# Patient Record
Sex: Female | Born: 1955 | Race: White | Hispanic: No | Marital: Married | Smoking: Former smoker
Health system: Southern US, Community
[De-identification: ages and names within clinical notes are randomized; demographics above are authoritative.]

## PROBLEM LIST (undated history)

## (undated) DIAGNOSIS — C50919 Malignant neoplasm of unspecified site of unspecified female breast: Secondary | ICD-10-CM

## (undated) DIAGNOSIS — J302 Other seasonal allergic rhinitis: Secondary | ICD-10-CM

## (undated) DIAGNOSIS — Z923 Personal history of irradiation: Secondary | ICD-10-CM

## (undated) DIAGNOSIS — Z8619 Personal history of other infectious and parasitic diseases: Secondary | ICD-10-CM

## (undated) DIAGNOSIS — K219 Gastro-esophageal reflux disease without esophagitis: Secondary | ICD-10-CM

## (undated) HISTORY — PX: BREAST LUMPECTOMY: SHX2

## (undated) HISTORY — DX: Malignant neoplasm of unspecified site of unspecified female breast: C50.919

## (undated) HISTORY — PX: ABDOMINAL SURGERY: SHX537

---

## 1998-08-17 ENCOUNTER — Other Ambulatory Visit: Admission: RE | Admit: 1998-08-17 | Discharge: 1998-08-17 | Payer: Self-pay | Admitting: Gynecology

## 1999-08-25 ENCOUNTER — Other Ambulatory Visit: Admission: RE | Admit: 1999-08-25 | Discharge: 1999-08-25 | Payer: Self-pay | Admitting: Gynecology

## 2000-09-12 ENCOUNTER — Other Ambulatory Visit: Admission: RE | Admit: 2000-09-12 | Discharge: 2000-09-12 | Payer: Self-pay | Admitting: Gynecology

## 2001-10-01 ENCOUNTER — Other Ambulatory Visit: Admission: RE | Admit: 2001-10-01 | Discharge: 2001-10-01 | Payer: Self-pay | Admitting: Gynecology

## 2001-10-23 ENCOUNTER — Encounter: Payer: Self-pay | Admitting: *Deleted

## 2001-10-23 ENCOUNTER — Encounter: Admission: RE | Admit: 2001-10-23 | Discharge: 2001-10-23 | Payer: Self-pay | Admitting: *Deleted

## 2002-03-28 ENCOUNTER — Ambulatory Visit (HOSPITAL_COMMUNITY): Admission: RE | Admit: 2002-03-28 | Discharge: 2002-03-28 | Payer: Self-pay | Admitting: *Deleted

## 2002-03-28 ENCOUNTER — Encounter: Payer: Self-pay | Admitting: *Deleted

## 2002-07-03 ENCOUNTER — Encounter: Admission: RE | Admit: 2002-07-03 | Discharge: 2002-07-03 | Payer: Self-pay | Admitting: Family Medicine

## 2002-07-03 ENCOUNTER — Encounter: Payer: Self-pay | Admitting: Family Medicine

## 2002-11-07 ENCOUNTER — Ambulatory Visit (HOSPITAL_COMMUNITY): Admission: RE | Admit: 2002-11-07 | Discharge: 2002-11-07 | Payer: Self-pay | Admitting: Gastroenterology

## 2002-11-07 ENCOUNTER — Encounter (INDEPENDENT_AMBULATORY_CARE_PROVIDER_SITE_OTHER): Payer: Self-pay | Admitting: Specialist

## 2002-11-12 ENCOUNTER — Other Ambulatory Visit: Admission: RE | Admit: 2002-11-12 | Discharge: 2002-11-12 | Payer: Self-pay | Admitting: Gynecology

## 2003-07-28 ENCOUNTER — Encounter: Payer: Self-pay | Admitting: Family Medicine

## 2003-07-28 ENCOUNTER — Encounter: Admission: RE | Admit: 2003-07-28 | Discharge: 2003-07-28 | Payer: Self-pay | Admitting: Family Medicine

## 2003-08-02 ENCOUNTER — Emergency Department (HOSPITAL_COMMUNITY): Admission: EM | Admit: 2003-08-02 | Discharge: 2003-08-02 | Payer: Self-pay | Admitting: Emergency Medicine

## 2003-08-08 ENCOUNTER — Encounter: Payer: Self-pay | Admitting: Gastroenterology

## 2003-08-08 ENCOUNTER — Ambulatory Visit (HOSPITAL_COMMUNITY): Admission: RE | Admit: 2003-08-08 | Discharge: 2003-08-08 | Payer: Self-pay | Admitting: Gastroenterology

## 2003-12-17 ENCOUNTER — Other Ambulatory Visit: Admission: RE | Admit: 2003-12-17 | Discharge: 2003-12-17 | Payer: Self-pay | Admitting: Gynecology

## 2003-12-23 ENCOUNTER — Encounter (INDEPENDENT_AMBULATORY_CARE_PROVIDER_SITE_OTHER): Payer: Self-pay | Admitting: Specialist

## 2003-12-23 ENCOUNTER — Ambulatory Visit (HOSPITAL_COMMUNITY): Admission: RE | Admit: 2003-12-23 | Discharge: 2003-12-23 | Payer: Self-pay | Admitting: Gastroenterology

## 2005-01-03 ENCOUNTER — Encounter: Admission: RE | Admit: 2005-01-03 | Discharge: 2005-01-03 | Payer: Self-pay | Admitting: Family Medicine

## 2005-01-11 ENCOUNTER — Encounter: Admission: RE | Admit: 2005-01-11 | Discharge: 2005-01-11 | Payer: Self-pay | Admitting: Family Medicine

## 2005-02-07 ENCOUNTER — Ambulatory Visit (HOSPITAL_COMMUNITY): Admission: RE | Admit: 2005-02-07 | Discharge: 2005-02-07 | Payer: Self-pay | Admitting: Gynecology

## 2005-08-23 ENCOUNTER — Encounter: Admission: RE | Admit: 2005-08-23 | Discharge: 2005-08-23 | Payer: Self-pay | Admitting: Gastroenterology

## 2005-09-02 ENCOUNTER — Encounter: Admission: RE | Admit: 2005-09-02 | Discharge: 2005-09-02 | Payer: Self-pay | Admitting: Gastroenterology

## 2006-02-20 ENCOUNTER — Other Ambulatory Visit: Admission: RE | Admit: 2006-02-20 | Discharge: 2006-02-20 | Payer: Self-pay | Admitting: Gynecology

## 2006-02-21 ENCOUNTER — Encounter: Admission: RE | Admit: 2006-02-21 | Discharge: 2006-02-21 | Payer: Self-pay | Admitting: Family Medicine

## 2006-03-09 ENCOUNTER — Emergency Department (HOSPITAL_COMMUNITY): Admission: EM | Admit: 2006-03-09 | Discharge: 2006-03-09 | Payer: Self-pay | Admitting: Emergency Medicine

## 2006-06-16 ENCOUNTER — Encounter: Admission: RE | Admit: 2006-06-16 | Discharge: 2006-06-16 | Payer: Self-pay | Admitting: Family Medicine

## 2006-09-05 ENCOUNTER — Encounter: Admission: RE | Admit: 2006-09-05 | Discharge: 2006-09-05 | Payer: Self-pay | Admitting: Family Medicine

## 2006-10-13 ENCOUNTER — Encounter: Admission: RE | Admit: 2006-10-13 | Discharge: 2006-10-13 | Payer: Self-pay | Admitting: Family Medicine

## 2007-04-02 ENCOUNTER — Encounter: Admission: RE | Admit: 2007-04-02 | Discharge: 2007-04-02 | Payer: Self-pay | Admitting: Family Medicine

## 2007-10-10 ENCOUNTER — Encounter: Admission: RE | Admit: 2007-10-10 | Discharge: 2007-10-10 | Payer: Self-pay | Admitting: Family Medicine

## 2007-12-31 ENCOUNTER — Ambulatory Visit (HOSPITAL_COMMUNITY): Admission: RE | Admit: 2007-12-31 | Discharge: 2007-12-31 | Payer: Self-pay | Admitting: Family Medicine

## 2008-01-14 ENCOUNTER — Other Ambulatory Visit: Admission: RE | Admit: 2008-01-14 | Discharge: 2008-01-14 | Payer: Self-pay | Admitting: Gynecology

## 2009-06-23 ENCOUNTER — Encounter: Admission: RE | Admit: 2009-06-23 | Discharge: 2009-06-23 | Payer: Self-pay | Admitting: Family Medicine

## 2009-12-16 ENCOUNTER — Encounter: Admission: RE | Admit: 2009-12-16 | Discharge: 2009-12-16 | Payer: Self-pay | Admitting: Otolaryngology

## 2011-04-22 NOTE — Op Note (Signed)
   NAME:  Angelica Hartman, Angelica Hartman                            ACCOUNT NO.:  [id number]   MEDICAL RECORD NO.:  [id number]                   PATIENT TYPE:  AMB   LOCATION:  ENDO                                 FACILITY:  [facility]   PHYSICIAN:  John C. Madilyn Fireman, M.D.                 DATE OF BIRTH:  1956/09/30   DATE OF PROCEDURE:  11/07/2002  DATE OF DISCHARGE:                                 OPERATIVE REPORT   PROCEDURE:  Esophagogastroduodenoscopy.   INDICATIONS FOR PROCEDURE:  Left upper quadrant abdominal pain previously  temporarily relieved by treatment for eradication of helicobacter but with  subsequent recurrence with some inconsistent response to antacids but little  response to proton pump inhibitor.   DESCRIPTION OF PROCEDURE:  The patient was placed in the left lateral  decubitus position then placed on the pulse monitor with continuous low flow  oxygen delivered by nasal cannula. She was sedated with 75 mcg IV fentanyl  and 8 mg IV Versed. The Olympus video endoscope was advanced under direct  vision into the oropharynx and esophagus. The esophagus was straight and of  normal caliber with the squamocolumnar line at 38 cm. There was no visible  hiatal hernia, ring, stricture or other abnormality of the GE junction. The  stomach was entered and a small amount of liquid secretions were suctioned  from the fundus. Retroflexed view of the cardia was unremarkable. The  fundus, body, antrum and pylorus all appeared normal. The duodenum was  entered and both the bulb and second portion are well inspected and appear  to be within normal limits.  The scope was then withdrawn back into the  stomach and CLOtest and biopsies were obtained. The scope was then withdrawn  and the patient returned to the recovery room in stable condition. She  tolerated the procedure well and there were no immediate complications.   IMPRESSION:  Basically normal endoscopy.   PLAN:  Await biopsies and CLOtest for now and  will treat for eradication of  helicobacter is positive.                                                John C. Madilyn Fireman, M.D.    JCH/MEDQ  D:  11/07/2002  T:  11/07/2002  Job:  [id number]   cc:   L. Lupe Carney, M.D.  [address] [place]  [place] [address]  Fax: [phone]

## 2011-04-22 NOTE — Op Note (Signed)
Angelica Hartman, Angelica Hartman                            ACCOUNT NO.:  [id number]   MEDICAL RECORD NO.:  [id number]                   PATIENT TYPE:  AMB   LOCATION:  ENDO                                 FACILITY:  [facility]   PHYSICIAN:  John C. Madilyn Fireman, M.D.                 DATE OF BIRTH:  Aug 01, 1956   DATE OF PROCEDURE:  12/23/2003  DATE OF DISCHARGE:                                 OPERATIVE REPORT   PROCEDURE:  Colonoscopy with polypectomy.   INDICATIONS FOR PROCEDURE:  Left upper quadrant abdominal pain with negative  workup to date.   DESCRIPTION OF PROCEDURE:  The patient was placed in the left lateral  decubitus position then placed on the pulse monitor with continuous low flow  oxygen delivered by nasal cannula. She was sedated with 100 mcg IV fentanyl  and 10 mg IV Versed. The Olympus video colonoscope was inserted into the  rectum and advanced to the cecum, confirmed by transillumination at  McBurney's point and visualization of the ileocecal valve and appendiceal  orifice. The prep was excellent. The cecum, ascending, transverse,  descending and sigmoid colon all appeared normal with no masses, polyps,  diverticula or other mucosal abnormalities. There was an 8 mm rectal polyp  at 15 cm and 6 mm polyp at 12 cm which were both removed by snare.  The  remainder of the rectum appeared normal.  The scope was then withdrawn and  the patient returned to the recovery room in stable condition.  She  tolerated the procedure well and there were no immediate complications.   IMPRESSION:  Rectal polyps otherwise normal study.   PLAN:  Await histology to determine method and interval for future colon  screening.                                               John C. Madilyn Fireman, M.D.    JCH/MEDQ  D:  12/23/2003  T:  12/23/2003  Job:  [id number]   cc:   L. Lupe Carney, M.D.  [address] [place]  [place] [address]  Fax: [phone]

## 2012-03-13 ENCOUNTER — Other Ambulatory Visit: Payer: Self-pay | Admitting: Family Medicine

## 2012-03-13 DIAGNOSIS — R52 Pain, unspecified: Secondary | ICD-10-CM

## 2012-03-15 ENCOUNTER — Ambulatory Visit
Admission: RE | Admit: 2012-03-15 | Discharge: 2012-03-15 | Disposition: A | Discharge status: Home / Self Care | Payer: BC Managed Care – PPO | Source: Ambulatory Visit | Attending: Family Medicine | Admitting: Family Medicine

## 2012-03-15 DIAGNOSIS — R52 Pain, unspecified: Secondary | ICD-10-CM

## 2013-10-14 ENCOUNTER — Emergency Department (INDEPENDENT_AMBULATORY_CARE_PROVIDER_SITE_OTHER)
Admission: EM | Admit: 2013-10-14 | Discharge: 2013-10-14 | Disposition: A | Discharge status: Home / Self Care | Payer: BC Managed Care – PPO | Source: Home / Self Care | Attending: Family Medicine | Admitting: Family Medicine

## 2013-10-14 ENCOUNTER — Encounter: Payer: Self-pay | Admitting: Emergency Medicine

## 2013-10-14 DIAGNOSIS — B001 Herpesviral vesicular dermatitis: Secondary | ICD-10-CM

## 2013-10-14 DIAGNOSIS — J069 Acute upper respiratory infection, unspecified: Secondary | ICD-10-CM

## 2013-10-14 DIAGNOSIS — B009 Herpesviral infection, unspecified: Secondary | ICD-10-CM

## 2013-10-14 HISTORY — DX: Other seasonal allergic rhinitis: J30.2

## 2013-10-14 MED ORDER — BENZONATATE 200 MG PO CAPS: 200.0000 mg | ORAL_CAPSULE | Freq: Every day | ORAL | Status: DC

## 2013-10-14 MED ORDER — VALACYCLOVIR HCL 1 G PO TABS: ORAL_TABLET | ORAL | Status: DC

## 2013-10-14 NOTE — ED Notes (Signed)
Pt c/o RT ear ache, nasal congestion, cold sore under nose, HA and pressure behind her eyes x 5 days. denies fever.

## 2013-10-14 NOTE — ED Provider Notes (Signed)
CSN: [id number]     Arrival date & time 10/14/13  1054 History   First MD Initiated Contact with Patient 10/14/13 1110     Chief Complaint  Patient presents with  . Nasal Congestion  . Otalgia     HPI Comments: Patient complains of onset of a right earache and right sore neck 5 days ago.  She felt fatigued and developed sinus congestion but no sore throat.  Yesterday she developed a headache and a "cold sore" underneath her nose.  No fevers, chills, and sweats   The history is provided by the patient.    Past Medical History  Diagnosis Date  . Seasonal allergies    Past Surgical History  Procedure Laterality Date  . Abdominal surgery    . Breast lumpectomy Right    Family History  Problem Relation Age of Onset  . Alzheimer's disease Mother    History  Substance Use Topics  . Smoking status: Former Games developer  . Smokeless tobacco: Not on file  . Alcohol Use: No   OB History   Grav Para Term Preterm Abortions TAB SAB Ect Mult Living                 Review of Systems No sore throat No cough No pleuritic pain No wheezing + cold sore + nasal congestion + post-nasal drainage No sinus pain/pressure No itchy/red eyes + right earache No hemoptysis No SOB No fever/chills No nausea No vomiting No abdominal pain No diarrhea No urinary symptoms No skin rash + fatigue No myalgias + headache Used OTC meds without relief  Allergies  Codeine and Penicillins  Home Medications   Current Outpatient Rx  Name  Route  Sig  Dispense  Refill  . cetirizine (ZYRTEC) 10 MG tablet   Oral   Take 10 mg by mouth daily.         . polyethylene glycol (MIRALAX / GLYCOLAX) packet   Oral   Take 17 g by mouth daily.         . benzonatate (TESSALON) 200 MG capsule   Oral   Take 1 capsule (200 mg total) by mouth at bedtime. Take as needed for cough   12 capsule   0   . valACYclovir (VALTREX) 1000 MG tablet      Take two tabs by mouth every 12 hours for one day.  Take at  onset of cold sore   4 tablet   1    BP 112/69  Pulse 60  Temp(Src) 97.7 F (36.5 C) (Oral)  Resp 16  Ht 5\' 1"  (1.549 m)  Wt 136 lb (61.689 kg)  BMI 25.71 kg/m2  SpO2 100% Physical Exam  HENT:  Nose:    Just under patient's nares there is a small 8mm patch of erythema with tenderness to palpation but no swelling or vesicles.  Nursing notes and Vital Signs reviewed. Appearance:  Patient appears healthy, stated age, and in no acute distress Eyes:  Pupils are equal, round, and reactive to light and accomodation.  Extraocular movement is intact.  Conjunctivae are not inflamed  Ears:  Canals normal.  Tympanic membranes normal.  Nose:  Congested turbinates.  No sinus tenderness. Marland Kitchen  Pharynx:  Normal Neck:  Supple.  Tender shotty right posterior nodes are present. Lungs:  Clear to auscultation.  Breath sounds are equal.  Heart:  Regular rate and rhythm without murmurs, rubs, or gallops.  Abdomen:  Nontender without masses or hepatosplenomegaly.  Bowel sounds are present.  No  CVA or flank tenderness.  Extremities:  No edema.  No calf tenderness Skin:  No rash present.    ED Course  Procedures  none       MDM   1. Acute upper respiratory infections of unspecified site   2. Cold sore    There is no evidence of bacterial infection today.   Rx for Valtrex. If increasing cold symptoms develop: Take Mucinex D (guaifenesin with decongestant) twice daily for congestion.  Increase fluid intake, rest. May use Afrin nasal spray (or generic oxymetazoline) twice daily for about 5 days.  Also recommend using saline nasal spray several times daily and saline nasal irrigation (AYR is a common brand).  May use Flonase spray after Afrin and saline. Stop all antihistamines for now, and other non-prescription cough/cold preparations. Use Tessalon at night for cough Follow-up with family doctor if not improving about10 days.     Lattie Haw, MD 10/14/13 [phone]

## 2014-04-22 ENCOUNTER — Other Ambulatory Visit: Payer: Self-pay | Admitting: Family Medicine

## 2014-04-22 DIAGNOSIS — G4482 Headache associated with sexual activity: Secondary | ICD-10-CM

## 2014-04-29 ENCOUNTER — Ambulatory Visit
Admission: RE | Admit: 2014-04-29 | Discharge: 2014-04-29 | Disposition: A | Discharge status: Home / Self Care | Payer: BC Managed Care – PPO | Source: Ambulatory Visit | Attending: Family Medicine | Admitting: Family Medicine

## 2014-04-29 DIAGNOSIS — G4482 Headache associated with sexual activity: Secondary | ICD-10-CM

## 2014-04-29 MED ORDER — GADOBENATE DIMEGLUMINE 529 MG/ML IV SOLN
13.0000 mL | Freq: Once | INTRAVENOUS | Status: AC | PRN
  Administered 2014-04-29: 13 mL via INTRAVENOUS

## 2014-07-24 ENCOUNTER — Other Ambulatory Visit: Payer: Self-pay

## 2014-07-28 LAB — CYTOLOGY - PAP

## 2014-08-19 ENCOUNTER — Emergency Department (INDEPENDENT_AMBULATORY_CARE_PROVIDER_SITE_OTHER)
Admission: EM | Admit: 2014-08-19 | Discharge: 2014-08-19 | Disposition: A | Discharge status: Home / Self Care | Payer: BC Managed Care – PPO | Source: Home / Self Care | Attending: Physician Assistant | Admitting: Physician Assistant

## 2014-08-19 ENCOUNTER — Encounter: Payer: Self-pay | Admitting: Emergency Medicine

## 2014-08-19 DIAGNOSIS — K589 Irritable bowel syndrome without diarrhea: Secondary | ICD-10-CM

## 2014-08-19 DIAGNOSIS — K219 Gastro-esophageal reflux disease without esophagitis: Secondary | ICD-10-CM

## 2014-08-19 HISTORY — DX: Personal history of other infectious and parasitic diseases: Z86.19

## 2014-08-19 MED ORDER — DICYCLOMINE HCL 10 MG PO CAPS: 10.0000 mg | ORAL_CAPSULE | Freq: Three times a day (TID) | ORAL | Status: DC

## 2014-08-19 MED ORDER — OMEPRAZOLE 40 MG PO CPDR: 40.0000 mg | DELAYED_RELEASE_CAPSULE | Freq: Every day | ORAL | Status: DC

## 2014-08-19 MED ORDER — GI COCKTAIL ~~LOC~~
30.0000 mL | Freq: Once | ORAL | Status: AC
  Administered 2014-08-19: 30 mL via ORAL

## 2014-08-19 MED ORDER — SERTRALINE HCL 25 MG PO TABS: 25.0000 mg | ORAL_TABLET | Freq: Every day | ORAL | Status: AC

## 2014-08-19 NOTE — Discharge Instructions (Signed)
Start priobiotic- Karn Pickler is a good one.  Start omeprazole for Acid reflux.  zoloft and bentyl for IBS.   Diet and Irritable Bowel Syndrome  No cure has been found for irritable bowel syndrome (IBS). Many options are available to treat the symptoms. Your caregiver will give you the best treatments available for your symptoms. He or she will also encourage you to manage stress and to make changes to your diet. You need to work with your caregiver and Registered Dietician to find the best combination of medicine, diet, counseling, and support to control your symptoms. The following are some diet suggestions. FOODS THAT MAKE IBS WORSE  Fatty foods, such as [place] fries.  Milk products, such as cheese or ice cream.  Chocolate.  Alcohol.  Caffeine (found in coffee and some sodas).  Carbonated drinks, such as soda. If certain foods cause symptoms, you should eat less of them or stop eating them. FOOD JOURNAL   Keep a journal of the foods that seem to cause distress. Write down:  What you are eating during the day and when.  What problems you are having after eating.  When the symptoms occur in relation to your meals.  What foods always make you feel badly.  Take your notes with you to your caregiver to see if you should stop eating certain foods. FOODS THAT MAKE IBS BETTER Fiber reduces IBS symptoms, especially constipation, because it makes stools soft, bulky, and easier to pass. Fiber is found in bran, bread, cereal, beans, fruit, and vegetables. Examples of foods with fiber include:  Apples.  Peaches.  Pears.  Berries.  Figs.  Broccoli, raw.  Cabbage.  Carrots.  Raw peas.  Kidney beans.  Lima beans.  Whole-grain bread.  Whole-grain cereal. Add foods with fiber to your diet a little at a time. This will let your body get used to them. Too much fiber at once might cause gas and swelling of your abdomen. This can trigger symptoms in a person with IBS. Caregivers  usually recommend a diet with enough fiber to produce soft, painless bowel movements. High fiber diets may cause gas and bloating. However, these symptoms often go away within a few weeks, as your body adjusts. In many cases, dietary fiber may lessen IBS symptoms, particularly constipation. However, it may not help pain or diarrhea. High fiber diets keep the colon mildly enlarged (distended) with the added fiber. This may help prevent spasms in the colon. Some forms of fiber also keep water in the stool, thereby preventing hard stools that are difficult to pass.  Besides telling you to eat more foods with fiber, your caregiver may also tell you to get more fiber by taking a fiber pill or drinking water mixed with a special high fiber powder. An example of this is a natural fiber laxative containing psyllium seed.  TIPS  Large meals can cause cramping and diarrhea in people with IBS. If this happens to you, try eating 4 or 5 small meals a day, or try eating less at each of your usual 3 meals. It may also help if your meals are low in fat and high in carbohydrates. Examples of carbohydrates are pasta, rice, whole-grain breads and cereals, fruits, and vegetables.  If dairy products cause your symptoms to flare up, you can try eating less of those foods. You might be able to handle yogurt better than other dairy products, because it contains bacteria that helps with digestion. Dairy products are an important source of calcium and  other nutrients. If you need to avoid dairy products, be sure to talk with a Registered Dietitian about getting these nutrients through other food sources.  Drink enough water and fluids to keep your urine clear or pale yellow. This is important, especially if you have diarrhea. FOR MORE INFORMATION  International Foundation for Functional Gastrointestinal Disorders: www.iffgd.org  National Digestive Diseases Information Clearinghouse: digestive.StageSync.si Document Released:  02/11/2004 Document Revised: 02/13/2012 Document Reviewed: 02/21/2014 [facility] Patient Information 2015 [place], [place]. This information is not intended to replace advice given to you by your health care provider. Make sure you discuss any questions you have with your health care provider. Irritable Bowel Syndrome Irritable bowel syndrome (IBS) is caused by a disturbance of normal bowel function and is a common digestive disorder. You may also hear this condition called spastic colon, mucous colitis, and irritable colon. There is no cure for IBS. However, symptoms often gradually improve or disappear with a good diet, stress management, and medicine. This condition usually appears in late adolescence or early adulthood. Women develop it twice as often as men. CAUSES  After food has been digested and absorbed in the small intestine, waste material is moved into the large intestine, or colon. In the colon, water and salts are absorbed from the undigested products coming from the small intestine. The remaining residue, or fecal material, is held for elimination. Under normal circumstances, gentle, rhythmic contractions of the bowel walls push the fecal material along the colon toward the rectum. In IBS, however, these contractions are irregular and poorly coordinated. The fecal material is either retained too long, resulting in constipation, or expelled too soon, producing diarrhea. SIGNS AND SYMPTOMS  The most common symptom of IBS is abdominal pain. It is often in the lower left side of the abdomen, but it may occur anywhere in the abdomen. The pain comes from spasms of the bowel muscles happening too much and from the buildup of gas and fecal material in the colon. This pain:  Can range from sharp abdominal cramps to a dull, continuous ache.  Often worsens soon after eating.  Is often relieved by having a bowel movement or passing gas. Abdominal pain is usually accompanied by constipation, but it may  also produce diarrhea. The diarrhea often occurs right after a meal or upon waking up in the morning. The stools are often soft, watery, and flecked with mucus. Other symptoms of IBS include:  Bloating.  Loss of appetite.  Heartburn.  Backache.  Dull pain in the arms or shoulders.  Nausea.  Burping.  Vomiting.  Gas. IBS may also cause symptoms that are unrelated to the digestive system, such as:  Fatigue.  Headaches.  Anxiety.  Shortness of breath.  Trouble concentrating.  Dizziness. These symptoms tend to come and go. DIAGNOSIS  The symptoms of IBS may seem like symptoms of other, more serious digestive disorders. Your health care provider may want to perform tests to exclude these disorders.  TREATMENT Many medicines are available to help correct bowel function or relieve bowel spasms and abdominal pain. Among the medicines available are:  Laxatives for severe constipation and to help restore normal bowel habits.  Specific antidiarrheal medicines to treat severe or lasting diarrhea.  Antispasmodic agents to relieve intestinal cramps. Your health care provider may also decide to treat you with a mild tranquilizer or sedative during unusually stressful periods in your life. Your health care provider may also prescribe antidepressant medicine. The use of this medicine has been shown to reduce pain  and other symptoms of IBS. Remember that if any medicine is prescribed for you, you should take it exactly as directed. Make sure your health care provider knows how well it worked for you. HOME CARE INSTRUCTIONS   Take all medicines as directed by your health care provider.  Avoid foods that are high in fat or oils, such as heavy cream, butter, frankfurters, sausage, and other fatty meats.  Avoid foods that make you go to the bathroom, such as fruit, fruit juice, and dairy products.  Cut out carbonated drinks, chewing gum, and "gassy" foods such as beans and cabbage.  This may help relieve bloating and burping.  Eat foods with bran, and drink plenty of liquids with the bran foods. This helps relieve constipation.  Keep track of what foods seem to bring on your symptoms.  Avoid emotionally charged situations or circumstances that produce anxiety.  Start or continue exercising.  Get plenty of rest and sleep. Document Released: 11/21/2005 Document Revised: 11/26/2013 Document Reviewed: 07/11/2008 [facility] Patient Information 2015 [place], [place]. This information is not intended to replace advice given to you by your health care provider. Make sure you discuss any questions you have with your health care provider.

## 2014-08-19 NOTE — ED Provider Notes (Signed)
CSN: [id number]     Arrival date & time 08/19/14  1502 History   First MD Initiated Contact with Patient 08/19/14 1523     Chief Complaint  Patient presents with  . Gas   (Consider location/radiation/quality/duration/timing/severity/associated sxs/prior Treatment) HPI Pt is a 58 yo female who presents to the clinic with abdominal discomfort, gas, "gurgling feeling" for last 2 months. She has a hx of constipation that she takes miralax daily for. She now has 5-7 loose stools a day. She has cut back on miralax. Certain foods do make worse such as yogurt and salad. Denies any blood in stool. She has acid reflux and belching a lot she is not currently being treated with medications. She does admit to her job being more stressful. She had normal colonoscopy at 52. No fever, chills, nausea, vomiting.   Past Medical History  Diagnosis Date  . Seasonal allergies   . History of Helicobacter pylori infection    Past Surgical History  Procedure Laterality Date  . Abdominal surgery    . Breast lumpectomy Right    Family History  Problem Relation Age of Onset  . Alzheimer's disease Mother    History  Substance Use Topics  . Smoking status: Former Games developer  . Smokeless tobacco: Never Used  . Alcohol Use: No   OB History   Grav Para Term Preterm Abortions TAB SAB Ect Mult Living                 Review of Systems  All other systems reviewed and are negative.   Allergies  Codeine and Penicillins  Home Medications   Prior to Admission medications   Medication Sig Start Date End Date Taking? Authorizing Provider  benzonatate (TESSALON) 200 MG capsule Take 1 capsule (200 mg total) by mouth at bedtime. Take as needed for cough 10/14/13   Lattie Haw, MD  cetirizine (ZYRTEC) 10 MG tablet Take 10 mg by mouth daily.    Historical Provider, MD  dicyclomine (BENTYL) 10 MG capsule Take 1 capsule (10 mg total) by mouth 3 (three) times daily before meals. 08/19/14   Jade L Breeback, PA-C   omeprazole (PRILOSEC) 40 MG capsule Take 1 capsule (40 mg total) by mouth daily. 08/19/14   Jade L Breeback, PA-C  polyethylene glycol (MIRALAX / GLYCOLAX) packet Take 17 g by mouth daily.    Historical Provider, MD  sertraline (ZOLOFT) 25 MG tablet Take 1 tablet (25 mg total) by mouth daily. Increase to 2 tablets and 7 days. 08/19/14   Jade L Breeback, PA-C  valACYclovir (VALTREX) 1000 MG tablet Take two tabs by mouth every 12 hours for one day.  Take at onset of cold sore 10/14/13   Lattie Haw, MD   BP 119/77  Pulse 59  Temp(Src) 98.2 F (36.8 C) (Oral)  Resp 14  Wt 141 lb (63.957 kg)  SpO2 100% Physical Exam  Constitutional: She is oriented to person, place, and time. She appears well-developed and well-nourished.  HENT:  Head: Normocephalic and atraumatic.  Cardiovascular: Normal rate, regular rhythm and normal heart sounds.   Pulmonary/Chest: Effort normal and breath sounds normal. She has no wheezes.  Negative for any CVA tenderness.   Abdominal: Soft.  Hyperactive bowel sounds.  Discomfort over entire abdomen but no overt pain.  No guarding or rebound.   Neurological: She is alert and oriented to person, place, and time.  Skin: Skin is dry.  Psychiatric: She has a normal mood and affect. Her behavior is  normal.    ED Course  Procedures (including critical care time) Labs Review Labs Reviewed - No data to display  Imaging Review No results found.   MDM   1. Gastroesophageal reflux disease, esophagitis presence not specified   2. IBS (irritable bowel syndrome)    GI cocktail given in office today with 30 percent relief by the time she left.  Pt started on omeprazole in the morning.  Stop miralax.  Start probiotic daily.  Start bentyl as needed up to three times a day before meals.  Start zoloft  can increase to  after 7 days. SE effects of worsening depression, sexual side effects, and weight gain discussed. Call with side effects.   Discussed  importance of follow up with PCP before medications run out or if symptoms worsening.  Red flag symptoms discussed.   IBS diet and HO given.    Jomarie Longs, PA-C 08/19/14 1612  Jomarie Longs, PA-C 08/19/14 [phone]

## 2014-08-19 NOTE — ED Notes (Signed)
Angelica Hartman c/o abdominal discomfort. She feels like her GI tract is always "moving" and she is frequently gassy. Stools are loose. No blood in stool.

## 2014-08-25 NOTE — ED Provider Notes (Signed)
Agree with exam, assessment, and plan.   Lattie Haw, MD 08/25/14 1158

## 2014-09-17 ENCOUNTER — Other Ambulatory Visit: Payer: Self-pay | Admitting: Gastroenterology

## 2014-09-17 DIAGNOSIS — R1013 Epigastric pain: Secondary | ICD-10-CM

## 2014-09-17 DIAGNOSIS — IMO0001 Reserved for inherently not codable concepts without codable children: Secondary | ICD-10-CM

## 2014-09-19 ENCOUNTER — Ambulatory Visit
Admission: RE | Admit: 2014-09-19 | Discharge: 2014-09-19 | Disposition: A | Discharge status: Home / Self Care | Payer: BC Managed Care – PPO | Source: Ambulatory Visit | Attending: Gastroenterology | Admitting: Gastroenterology

## 2014-09-19 DIAGNOSIS — R1013 Epigastric pain: Secondary | ICD-10-CM

## 2014-09-19 DIAGNOSIS — IMO0001 Reserved for inherently not codable concepts without codable children: Secondary | ICD-10-CM

## 2014-10-07 ENCOUNTER — Other Ambulatory Visit: Payer: Self-pay | Admitting: Gastroenterology

## 2014-10-07 DIAGNOSIS — R1013 Epigastric pain: Secondary | ICD-10-CM

## 2014-10-14 ENCOUNTER — Encounter (INDEPENDENT_AMBULATORY_CARE_PROVIDER_SITE_OTHER): Payer: Self-pay

## 2014-10-14 ENCOUNTER — Ambulatory Visit
Admission: RE | Admit: 2014-10-14 | Discharge: 2014-10-14 | Disposition: A | Discharge status: Home / Self Care | Payer: BC Managed Care – PPO | Source: Ambulatory Visit | Attending: Gastroenterology | Admitting: Gastroenterology

## 2014-10-14 DIAGNOSIS — R1013 Epigastric pain: Secondary | ICD-10-CM

## 2015-07-28 ENCOUNTER — Other Ambulatory Visit: Payer: Self-pay | Admitting: Gynecology

## 2015-07-29 LAB — CYTOLOGY - PAP

## 2016-03-07 ENCOUNTER — Encounter (HOSPITAL_COMMUNITY): Payer: Self-pay | Admitting: Adult Health

## 2016-03-07 ENCOUNTER — Emergency Department (HOSPITAL_COMMUNITY)
Admission: EM | Admit: 2016-03-07 | Discharge: 2016-03-07 | Disposition: A | Discharge status: Home / Self Care | Payer: BLUE CROSS/BLUE SHIELD | Attending: Emergency Medicine | Admitting: Emergency Medicine

## 2016-03-07 DIAGNOSIS — L509 Urticaria, unspecified: Secondary | ICD-10-CM | POA: Insufficient documentation

## 2016-03-07 DIAGNOSIS — L500 Allergic urticaria: Secondary | ICD-10-CM | POA: Diagnosis not present

## 2016-03-07 DIAGNOSIS — R21 Rash and other nonspecific skin eruption: Secondary | ICD-10-CM | POA: Diagnosis present

## 2016-03-07 DIAGNOSIS — Z87891 Personal history of nicotine dependence: Secondary | ICD-10-CM | POA: Insufficient documentation

## 2016-03-07 DIAGNOSIS — Z79899 Other long term (current) drug therapy: Secondary | ICD-10-CM | POA: Diagnosis not present

## 2016-03-07 DIAGNOSIS — Z8619 Personal history of other infectious and parasitic diseases: Secondary | ICD-10-CM | POA: Diagnosis not present

## 2016-03-07 DIAGNOSIS — Z880 Allergy status to penicillin: Secondary | ICD-10-CM | POA: Insufficient documentation

## 2016-03-07 MED ORDER — FAMOTIDINE 20 MG PO TABS
40.0000 mg | ORAL_TABLET | Freq: Once | ORAL | Status: AC
  Administered 2016-03-07: 40 mg via ORAL
  Filled 2016-03-07: qty 2

## 2016-03-07 MED ORDER — HYDROCORTISONE 2.5 % EX LOTN: TOPICAL_LOTION | Freq: Two times a day (BID) | CUTANEOUS | Status: DC

## 2016-03-07 MED ORDER — HYDROXYZINE HCL 25 MG PO TABS
50.0000 mg | ORAL_TABLET | Freq: Once | ORAL | Status: AC
  Administered 2016-03-07: 50 mg via ORAL
  Filled 2016-03-07: qty 2

## 2016-03-07 MED ORDER — CETIRIZINE HCL 10 MG PO TABS: 10.0000 mg | ORAL_TABLET | Freq: Every day | ORAL | Status: AC

## 2016-03-07 MED ORDER — PREDNISONE 20 MG PO TABS
60.0000 mg | ORAL_TABLET | Freq: Once | ORAL | Status: AC
  Administered 2016-03-07: 60 mg via ORAL
  Filled 2016-03-07: qty 3

## 2016-03-07 MED ORDER — HYDROXYZINE HCL 25 MG PO TABS: 25.0000 mg | ORAL_TABLET | Freq: Four times a day (QID) | ORAL | Status: DC | PRN

## 2016-03-07 MED ORDER — PREDNISONE 20 MG PO TABS: 40.0000 mg | ORAL_TABLET | Freq: Every day | ORAL | Status: DC

## 2016-03-07 NOTE — ED Notes (Signed)
MD at bedside. 

## 2016-03-07 NOTE — Discharge Instructions (Signed)
Hives Hives are itchy, red, swollen areas of the skin. They can vary in size and location on your body. Hives can come and go for hours or several days (acute hives) or for several weeks (chronic hives). Hives do not spread from person to person (noncontagious). They may get worse with scratching, exercise, and emotional stress. CAUSES   Allergic reaction to food, additives, or drugs.  Infections, including the common cold.  Illness, such as vasculitis, lupus, or thyroid disease.  Exposure to sunlight, heat, or cold.  Exercise.  Stress.  Contact with chemicals. SYMPTOMS   Red or white swollen patches on the skin. The patches may change size, shape, and location quickly and repeatedly.  Itching.  Swelling of the hands, feet, and face. This may occur if hives develop deeper in the skin. DIAGNOSIS  Your caregiver can usually tell what is wrong by performing a physical exam. Skin or blood tests may also be done to determine the cause of your hives. In some cases, the cause cannot be determined. TREATMENT  Mild cases usually get better with medicines such as antihistamines. Severe cases may require an emergency epinephrine injection. If the cause of your hives is known, treatment includes avoiding that trigger.  HOME CARE INSTRUCTIONS   Avoid causes that trigger your hives.  Take antihistamines as directed by your caregiver to reduce the severity of your hives. Non-sedating or low-sedating antihistamines are usually recommended. Do not drive while taking an antihistamine.  Take any other medicines prescribed for itching as directed by your caregiver.  Wear loose-fitting clothing.  Keep all follow-up appointments as directed by your caregiver. SEEK MEDICAL CARE IF:   You have persistent or severe itching that is not relieved with medicine.  You have painful or swollen joints. SEEK IMMEDIATE MEDICAL CARE IF:   You have a fever.  Your tongue or lips are swollen.  You have  trouble breathing or swallowing.  You feel tightness in the throat or chest.  You have abdominal pain. These problems may be the first sign of a life-threatening allergic reaction. Call your local emergency services (911 in [place] MAKE SURE YOU:   Understand these instructions.  Will watch your condition.  Will get help right away if you are not doing well or get worse.   This information is not intended to replace advice given to you by your health care provider. Make sure you discuss any questions you have with your health care provider.   Document Released: 11/21/2005 Document Revised: 11/26/2013 Document Reviewed: 02/14/2012 Elsevier Interactive Patient Education 2016 [employer] Inc.  

## 2016-03-07 NOTE — ED Provider Notes (Signed)
CSN: [id number]     Arrival date & time 03/07/16  0202 History   First MD Initiated Contact with Patient 03/07/16 [phone]     Chief Complaint  Patient presents with  . Rash     (Consider location/radiation/quality/duration/timing/severity/associated sxs/prior Treatment) HPI Comments: 60 year old female presents to the emergency department for 2 weeks of a rash. Rash initially started on her anterior right lower extremity. She describes the rash as itching and burning. She has since developed the rash to her proximal inner arms bilaterally. She states that she has been taking Benadryl consistently every 6 hours without relief of her symptoms. She denies any new soaps, lotions, detergents. She denies any changes to her daily medications. Patient also states that she has not been on any antibiotics recently. She has had no associated fever, difficulty swallowing, lip or tongue swelling, shortness of breath, nausea, vomiting, or exposure to persons with similar rash. She denies any known tick bites or situations which would promote tick exposure. She reports that she was supposed to follow up with her primary care doctor tomorrow, but the rash bothered her too much this evening prompting her to be seen in the emergency department.  Patient is a 60 y.o. female presenting with rash. The history is provided by the patient. No language interpreter was used.  Rash Associated symptoms: no fever, no nausea, no shortness of breath and not vomiting     Past Medical History  Diagnosis Date  . Seasonal allergies   . History of Helicobacter pylori infection    Past Surgical History  Procedure Laterality Date  . Abdominal surgery    . Breast lumpectomy Right    Family History  Problem Relation Age of Onset  . Alzheimer's disease Mother    Social History  Substance Use Topics  . Smoking status: Former Research scientist (life sciences)  . Smokeless tobacco: Never Used  . Alcohol Use: No   OB History    No data available       Review of Systems  Constitutional: Negative for fever.  HENT: Negative for drooling and trouble swallowing.   Respiratory: Negative for shortness of breath.   Gastrointestinal: Negative for nausea and vomiting.  Skin: Positive for rash.  All other systems reviewed and are negative.   Allergies  Codeine and Penicillins  Home Medications   Prior to Admission medications   Medication Sig Start Date End Date Taking? Authorizing Provider  diphenhydrAMINE (BENADRYL) 25 MG tablet Take 50 mg by mouth every 6 (six) hours as needed for itching or allergies.   Yes Historical Provider, MD  estradiol-levonorgestrel [facility]) 0.045-0.015 MG/DAY Place 1 patch onto the skin once a week.   Yes Historical Provider, MD  polyethylene glycol (MIRALAX / GLYCOLAX) packet Take 17 g by mouth daily as needed for mild constipation.    Yes Historical Provider, MD  sertraline (ZOLOFT) 25 MG tablet Take 1 tablet (25 mg total) by mouth daily. Increase to 2 tablets and 7 days. Patient taking differently: Take 25 mg by mouth daily.  08/19/14  Yes Jade L Breeback, PA-C  valACYclovir (VALTREX) 1000 MG tablet Take two tabs by mouth every 12 hours for one day.  Take at onset of cold sore Patient taking differently: Take 1,000 mg by mouth 2 (two) times daily as needed (cold sores).  10/14/13  Yes Kandra Nicolas, MD  cetirizine (ZYRTEC) 10 MG tablet Take 1 tablet (10 mg total) by mouth daily. 03/07/16   Antonietta Breach, PA-C  hydrocortisone 2.5 % lotion Apply topically  2 (two) times daily. Do not apply to face 03/07/16   Antonietta Breach, PA-C  hydrOXYzine (ATARAX/VISTARIL) 25 MG tablet Take 1 tablet (25 mg total) by mouth every 6 (six) hours as needed for itching. 03/07/16   Antonietta Breach, PA-C  predniSONE (DELTASONE) 20 MG tablet Take 2 tablets (40 mg total) by mouth daily. Take 40 mg by mouth daily for 6 days, then 20mg  by mouth daily for 6 days, then 10mg  daily for 6 days 03/07/16   Antonietta Breach, PA-C   BP 103/73 mmHg  Pulse 77   Temp(Src) 97.9 F (36.6 C) (Oral)  Resp 18  Ht 5\' 1"  (1.549 m)  Wt 64.156 kg  BMI 26.74 kg/m2  SpO2 98%   Physical Exam  Constitutional: She is oriented to person, place, and time. She appears well-developed and well-nourished. No distress.  Nontoxic appearing  HENT:  Head: Normocephalic and atraumatic.  Mouth/Throat: Oropharynx is clear and moist. No oropharyngeal exudate.  No angioedema. Patient tolerating secretions without difficulty.  Eyes: Conjunctivae and EOM are normal. No scleral icterus.  Neck: Normal range of motion.  No nuchal rigidity or meningismus  Pulmonary/Chest: Effort normal. No respiratory distress. She has no wheezes.  No wheezing or stridor. Respirations even and unlabored.  Musculoskeletal: Normal range of motion.  Neurological: She is alert and oriented to person, place, and time. She exhibits normal muscle tone. Coordination normal.  Skin: Skin is warm and dry. Rash noted. She is not diaphoretic. No erythema. No pallor.     Patient with a pruritic, erythematous, maculopapular rash which is slightly raised and blanching. Rash noted to anterior right lower extremity as well as to proximal volar aspects of her upper extremities. Mild areas of excoriation. No pustules or vesicles. No rash noted to palms or soles or webbed spaces. Negative Nikolsky sign.  Psychiatric: She has a normal mood and affect. Her behavior is normal.  Nursing note and vitals reviewed.   ED Course  Procedures (including critical care time) Labs Review Labs Reviewed - No data to display  Imaging Review No results found.   I have personally reviewed and evaluated these images and lab results as part of my medical decision-making.   EKG Interpretation None      MDM   Final diagnoses:  Urticaria of unknown origin    60 year old female percent to the emergency department for evaluation of 2 weeks of a blanching, erythematous and pruritic urticarial-appearing rash to her  anterior right lower extremity and bilateral upper extremities. Patient with no signs of respiratory distress or compromise. No angioedema. She denies any new ingestions, medications, or other exposures provoking symptoms. Given chronicity of symptoms, doubt emergent etiology. Rash is not consistent with SJS, erythema multiforme major, or erythema multiforme minor. Will treat symptomatically with an extended prednisone taper as well as Atarax. Patient referred to her PCP and dermatologist for follow-up. Return precautions discussed and provided. Patient discharged in satisfactory condition with no unaddressed concerns.   Filed Vitals:   03/07/16 0415 03/07/16 0416 03/07/16 0417 03/07/16 0418  BP:      Pulse: 69 77 73 77  Temp:      TempSrc:      Resp:      Height:      Weight:      SpO2: 96% 98% 100% 98%     Antonietta Breach, PA-C 03/07/16 [place], MD 03/07/16 2356

## 2016-03-07 NOTE — ED Notes (Addendum)
Presents with redness and papular rash to both arms described as itching and burning, and a red area that itches and burns to right lower leg. Began one week ago and is itching. Has a small area of red bumps to left lower leg. Denies fevers.  Endorses a recent illness 2 weeks ago, but nothing since. Rash is not weeping or open.  Taking benadryl with no relief

## 2016-06-09 DIAGNOSIS — R109 Unspecified abdominal pain: Secondary | ICD-10-CM | POA: Diagnosis not present

## 2016-10-12 DIAGNOSIS — J069 Acute upper respiratory infection, unspecified: Secondary | ICD-10-CM | POA: Diagnosis not present

## 2016-10-12 DIAGNOSIS — R21 Rash and other nonspecific skin eruption: Secondary | ICD-10-CM | POA: Diagnosis not present

## 2016-10-12 DIAGNOSIS — J301 Allergic rhinitis due to pollen: Secondary | ICD-10-CM | POA: Diagnosis not present

## 2016-10-12 DIAGNOSIS — J3089 Other allergic rhinitis: Secondary | ICD-10-CM | POA: Diagnosis not present

## 2016-10-12 DIAGNOSIS — J3081 Allergic rhinitis due to animal (cat) (dog) hair and dander: Secondary | ICD-10-CM | POA: Diagnosis not present

## 2016-11-01 DIAGNOSIS — R05 Cough: Secondary | ICD-10-CM | POA: Diagnosis not present

## 2016-11-10 ENCOUNTER — Other Ambulatory Visit: Payer: Self-pay | Admitting: Physician Assistant

## 2016-11-10 ENCOUNTER — Ambulatory Visit
Admission: RE | Admit: 2016-11-10 | Discharge: 2016-11-10 | Disposition: A | Discharge status: Home / Self Care | Payer: BLUE CROSS/BLUE SHIELD | Source: Ambulatory Visit | Attending: Physician Assistant | Admitting: Physician Assistant

## 2016-11-10 DIAGNOSIS — R079 Chest pain, unspecified: Secondary | ICD-10-CM

## 2016-11-10 DIAGNOSIS — R05 Cough: Secondary | ICD-10-CM | POA: Diagnosis not present

## 2016-11-17 DIAGNOSIS — Z6827 Body mass index (BMI) 27.0-27.9, adult: Secondary | ICD-10-CM | POA: Diagnosis not present

## 2016-11-17 DIAGNOSIS — Z01419 Encounter for gynecological examination (general) (routine) without abnormal findings: Secondary | ICD-10-CM | POA: Diagnosis not present

## 2017-02-10 DIAGNOSIS — Z0000 Encounter for general adult medical examination without abnormal findings: Secondary | ICD-10-CM | POA: Diagnosis not present

## 2017-02-10 DIAGNOSIS — F419 Anxiety disorder, unspecified: Secondary | ICD-10-CM | POA: Diagnosis not present

## 2017-02-13 DIAGNOSIS — Z1231 Encounter for screening mammogram for malignant neoplasm of breast: Secondary | ICD-10-CM | POA: Diagnosis not present

## 2017-03-23 DIAGNOSIS — H43393 Other vitreous opacities, bilateral: Secondary | ICD-10-CM | POA: Diagnosis not present

## 2017-04-05 DIAGNOSIS — B001 Herpesviral vesicular dermatitis: Secondary | ICD-10-CM | POA: Diagnosis not present

## 2017-06-22 ENCOUNTER — Emergency Department (INDEPENDENT_AMBULATORY_CARE_PROVIDER_SITE_OTHER)
Admission: EM | Admit: 2017-06-22 | Discharge: 2017-06-22 | Disposition: A | Discharge status: Home / Self Care | Payer: BLUE CROSS/BLUE SHIELD | Source: Home / Self Care | Attending: Family Medicine | Admitting: Family Medicine

## 2017-06-22 DIAGNOSIS — H00014 Hordeolum externum left upper eyelid: Secondary | ICD-10-CM | POA: Diagnosis not present

## 2017-06-22 MED ORDER — ERYTHROMYCIN 5 MG/GM OP OINT: TOPICAL_OINTMENT | OPHTHALMIC | 0 refills | Status: DC

## 2017-06-22 NOTE — ED Provider Notes (Signed)
CSN: [id number]     Arrival date & time 06/22/17  1459 History   First MD Initiated Contact with Patient 06/22/17 1523     Chief Complaint  Patient presents with  . Eye Pain   (Consider location/radiation/quality/duration/timing/severity/associated sxs/prior Treatment) HPI Angelica Hartman is a 61 y.o. female presenting to UC with c/o Left upper eyelid pain, redness, and swelling that she noticed last night. Pain is burning, 5/10, denies discharge. Denies change in vision.  She does wear glasses but not contacts.  Denies trauma to eye. No recent illness.  She was around her grandchildren recently but states they did not have any symptoms.     Past Medical History:  Diagnosis Date  . History of Helicobacter pylori infection   . Seasonal allergies    Past Surgical History:  Procedure Laterality Date  . ABDOMINAL SURGERY    . BREAST LUMPECTOMY Right    Family History  Problem Relation Age of Onset  . Alzheimer's disease Mother    Social History  Substance Use Topics  . Smoking status: Former Games developer  . Smokeless tobacco: Never Used  . Alcohol use No   OB History    No data available     Review of Systems  Constitutional: Negative for chills and fever.  HENT: Negative for congestion and rhinorrhea.   Eyes: Positive for pain, redness and itching. Negative for photophobia, discharge and visual disturbance.       Left upper eyelid  Respiratory: Negative for cough.   Neurological: Negative for dizziness, light-headedness and headaches.    Allergies  Codeine and Penicillins  Home Medications   Prior to Admission medications   Medication Sig Start Date End Date Taking? Authorizing Provider  cetirizine (ZYRTEC) 10 MG tablet Take 1 tablet (10 mg total) by mouth daily. 03/07/16   Antony Madura, PA-C  diphenhydrAMINE (BENADRYL) 25 MG tablet Take 50 mg by mouth every 6 (six) hours as needed for itching or allergies.    [provider]  erythromycin ophthalmic ointment Apply  thin layer over sore area on eyelid 3 times daily for 5-7 days 06/22/17   Lurene Shadow, PA-C  estradiol-levonorgestrel [facility]) 0.045-0.015 MG/DAY Place 1 patch onto the skin once a week.    [provider]  hydrocortisone 2.5 % lotion Apply topically 2 (two) times daily. Do not apply to face 03/07/16   Antony Madura, PA-C  hydrOXYzine (ATARAX/VISTARIL) 25 MG tablet Take 1 tablet (25 mg total) by mouth every 6 (six) hours as needed for itching. 03/07/16   Antony Madura, PA-C  polyethylene glycol (MIRALAX / GLYCOLAX) packet Take 17 g by mouth daily as needed for mild constipation.     [provider]  predniSONE (DELTASONE) 20 MG tablet Take 2 tablets (40 mg total) by mouth daily. Take 40 mg by mouth daily for 6 days, then 20mg  by mouth daily for 6 days, then 10mg  daily for 6 days 03/07/16   Antony Madura, PA-C  sertraline (ZOLOFT) 25 MG tablet Take 1 tablet (25 mg total) by mouth daily. Increase to 2 tablets and 7 days. Patient taking differently: Take 25 mg by mouth daily.  08/19/14   Breeback, Lonna Cobb, PA-C  valACYclovir (VALTREX) 1000 MG tablet Take two tabs by mouth every 12 hours for one day.  Take at onset of cold sore Patient taking differently: Take 1,000 mg by mouth 2 (two) times daily as needed (cold sores).  10/14/13   Lattie Haw, MD   Meds Ordered and Administered  this Visit  Medications - No data to display  BP 131/87 (BP Location: Left Arm)   Pulse 63   Temp 98.4 F (36.9 C) (Oral)   Ht 5\' 1"  (1.549 m)   Wt 141 lb (64 kg)   SpO2 98%   BMI 26.64 kg/m  No data found.   Physical Exam  Constitutional: She is oriented to person, place, and time. She appears well-developed and well-nourished.  HENT:  Head: Normocephalic and atraumatic.  Eyes: Pupils are equal, round, and reactive to light. Conjunctivae and EOM are normal. Left eye exhibits hordeolum. Left eye exhibits no discharge. Left conjunctiva is not injected. Left conjunctiva has no hemorrhage.    Left  upper eyelid, medial canthus: mild edema with erythema, 3mm hordeolum. No drainage or bleeding.   Neck: Normal range of motion.  Cardiovascular: Normal rate.   Pulmonary/Chest: Effort normal.  Musculoskeletal: Normal range of motion.  Neurological: She is alert and oriented to person, place, and time.  Skin: Skin is warm and dry.  Psychiatric: She has a normal mood and affect. Her behavior is normal.  Nursing note and vitals reviewed.   Urgent Care Course     Procedures (including critical care time)  Labs Review Labs Reviewed - No data to display  Imaging Review No results found.   Visual Acuity Review  Right Eye Distance: 20/20 Left Eye Distance: 20/20 Bilateral Distance: 20/15   MDM   1. Hordeolum externum left upper eyelid    Hx and exam most c/w Left upper eyelid hordeolum   Rx: erythromycin ophthalmic ointment. Encouraged warm compresses F/u with PCP in 5-7 days if not improving, sooner if worsening.     Lurene Shadowhelps,  O, [place]PA-C 06/22/17 [phone]

## 2017-06-22 NOTE — Discharge Instructions (Signed)
°  Try to avoid using eye makeup until sore has healed.  You may want to then start with brand new eye makeup brushes to help prevent reinfecting your eye with the bacteria that may still be on the brushes or utensils.   Please follow up with your primary care provider or eye specialist in 5-7 days if not improving.

## 2017-06-22 NOTE — ED Triage Notes (Signed)
Pt started with left eye pain and swelling last night.

## 2017-09-07 ENCOUNTER — Emergency Department (INDEPENDENT_AMBULATORY_CARE_PROVIDER_SITE_OTHER): Payer: BLUE CROSS/BLUE SHIELD

## 2017-09-07 ENCOUNTER — Encounter: Payer: Self-pay | Admitting: Emergency Medicine

## 2017-09-07 ENCOUNTER — Emergency Department (INDEPENDENT_AMBULATORY_CARE_PROVIDER_SITE_OTHER)
Admission: EM | Admit: 2017-09-07 | Discharge: 2017-09-07 | Disposition: A | Discharge status: Home / Self Care | Payer: BLUE CROSS/BLUE SHIELD | Source: Home / Self Care | Attending: Emergency Medicine | Admitting: Emergency Medicine

## 2017-09-07 DIAGNOSIS — M545 Low back pain, unspecified: Secondary | ICD-10-CM

## 2017-09-07 DIAGNOSIS — S39012A Strain of muscle, fascia and tendon of lower back, initial encounter: Secondary | ICD-10-CM | POA: Diagnosis not present

## 2017-09-07 DIAGNOSIS — M5116 Intervertebral disc disorders with radiculopathy, lumbar region: Secondary | ICD-10-CM | POA: Diagnosis not present

## 2017-09-07 LAB — POCT URINALYSIS DIP (MANUAL ENTRY)
Bilirubin, UA: NEGATIVE
Glucose, UA: NEGATIVE mg/dL
Ketones, POC UA: NEGATIVE mg/dL
Leukocytes, UA: NEGATIVE
Nitrite, UA: NEGATIVE
Protein Ur, POC: NEGATIVE mg/dL
Spec Grav, UA: <=1.005 — AB (ref 1.010–1.025)
Urobilinogen, UA: 0.2 E.U./dL
pH, UA: 6 (ref 5.0–8.0)

## 2017-09-07 MED ORDER — KETOROLAC TROMETHAMINE 60 MG/2ML IM SOLN
60.0000 mg | Freq: Once | INTRAMUSCULAR | Status: AC
  Administered 2017-09-07: 60 mg via INTRAMUSCULAR

## 2017-09-07 MED ORDER — CYCLOBENZAPRINE HCL 10 MG PO TABS: 10.0000 mg | ORAL_TABLET | Freq: Every day | ORAL | 0 refills | Status: DC

## 2017-09-07 NOTE — ED Triage Notes (Signed)
Lower right back pain started yesterday, nausea

## 2017-09-07 NOTE — ED Provider Notes (Signed)
[facility]    CSN: [id number] Arrival date & time: 09/07/17  0944     History   Chief Complaint Chief Complaint  Patient presents with  . Back Pain    HPI Angelica Hartman is a 61 y.o. female.   The history is provided by the patient.  Back Pain  Location:  Lumbar spine Quality: "Heaviness, soreness type pain, like someone hit me" Radiates to:  R thigh Pain severity:  Moderate Pain is:  Same all the time Onset quality:  Unable to specify Duration:  1 day Timing:  Constant Progression:  Unchanged Chronicity:  New Context: not falling, not jumping from heights, not lifting heavy objects, not MVA, not occupational injury, not recent illness and not recent injury   Relieved by:  Being still Worsened by:  Sitting, movement and bending (Process of going from lying down to sitting up) Associated symptoms: leg pain (Right thigh)   Associated symptoms: no abdominal pain, no abdominal swelling, no chest pain, no dysuria, no fever, no numbness, no pelvic pain, no perianal numbness, no tingling and no weakness   Risk factors: not pregnant and no recent surgery    Lumbar pain, right greater than left, for one day. Pain worsens with bending. Pain radiates to right thigh. No history of trauma  Past Medical History:  Diagnosis Date  . History of Helicobacter pylori infection   . Seasonal allergies     There are no active problems to display for this patient.   Past Surgical History:  Procedure Laterality Date  . ABDOMINAL SURGERY    . BREAST LUMPECTOMY Right     OB History    No data available       Home Medications    Prior to Admission medications   Medication Sig Start Date End Date Taking? Authorizing Provider  cetirizine (ZYRTEC) 10 MG tablet Take 1 tablet (10 mg total) by mouth daily. 03/07/16   Antony Madura, PA-C  cyclobenzaprine (FLEXERIL) 10 MG tablet Take 1 tablet (10 mg total) by mouth at bedtime. For muscle relaxant 09/07/17   Lajean Manes, MD    diphenhydrAMINE (BENADRYL) 25 MG tablet Take 50 mg by mouth every 6 (six) hours as needed for itching or allergies.    [provider]  polyethylene glycol (MIRALAX / GLYCOLAX) packet Take 17 g by mouth daily as needed for mild constipation.     [provider]  sertraline (ZOLOFT) 25 MG tablet Take 1 tablet (25 mg total) by mouth daily. Increase to 2 tablets and 7 days. Patient taking differently: Take 25 mg by mouth daily.  08/19/14   Jomarie Longs, PA-C    Family History Family History  Problem Relation Age of Onset  . Alzheimer's disease Mother     Social History Social History  Substance Use Topics  . Smoking status: Former Games developer  . Smokeless tobacco: Never Used  . Alcohol use No     Allergies   Codeine and Penicillins   Review of Systems Review of Systems  Constitutional: Negative for fever.  Cardiovascular: Negative for chest pain.  Gastrointestinal: Negative for abdominal pain.  Genitourinary: Negative for dysuria and pelvic pain.  Musculoskeletal: Positive for back pain.  Neurological: Negative for tingling, weakness and numbness.  All other systems reviewed and are negative.    Physical Exam Triage Vital Signs ED Triage Vitals  Enc Vitals Group     BP 09/07/17 1024 120/78     Pulse Rate 09/07/17 1024 66  Resp --      Temp 09/07/17 1024 98.6 F (37 C)     Temp Source 09/07/17 1024 Oral     SpO2 09/07/17 1024 98 %     Weight 09/07/17 1024 148 lb (67.1 kg)     Height 09/07/17 1024  (1.549 m)     Head Circumference --      Peak Flow --      Pain Score 09/07/17 1025 8     Pain Loc --      Pain Edu? --      Excl. in GC? --    No data found.   Updated Vital Signs BP 120/78 (BP Location: Left Arm)   Pulse 66   Temp 98.6 F (37 C) (Oral)   Ht  (1.549 m)   Wt 148 lb (67.1 kg)   SpO2 98%   BMI 27.96 kg/m   Visual Acuity Right Eye Distance:   Left Eye Distance:   Bilateral Distance:    Right Eye Near:    Left Eye Near:    Bilateral Near:     Physical Exam  Constitutional: She is oriented to person, place, and time. She appears well-developed and well-nourished. She is cooperative.  Non-toxic appearance. She appears distressed (Appears uncomfortable from low back pain.).  HENT:  Head: Normocephalic and atraumatic.  Mouth/Throat: Oropharynx is clear and moist.  Eyes: Pupils are equal, round, and reactive to light. EOM are normal. No scleral icterus.  Neck: Neck supple.  Cardiovascular: Regular rhythm and normal heart sounds.   Pulmonary/Chest: Effort normal and breath sounds normal. No respiratory distress. She has no wheezes. She has no rales. She exhibits no tenderness.  Abdominal: Soft. There is no tenderness.  Musculoskeletal:       Right hip: Normal.       Left hip: Normal.       Cervical back: She exhibits no tenderness.       Thoracic back: She exhibits no tenderness.       Lumbar back: She exhibits decreased range of motion, tenderness and spasm. She exhibits no swelling, no edema, no deformity, no laceration and normal pulse.  Negative Right straight leg-raise test. Negative Left straight leg-raise test.  Negative Right Patrick test. Negative Left Patrick test.  When going from lying to sitting, that immediately reproduces mid and right low back pain    Neurological: She is alert and oriented to person, place, and time. She has normal strength. She displays no atrophy, no tremor and normal reflexes. No cranial nerve deficit or sensory deficit. She exhibits normal muscle tone. Gait normal.  Reflex Scores:      Patellar reflexes are 2+ on the right side and 2+ on the left side.      Achilles reflexes are 2+ on the right side and 2+ on the left side. Skin: Skin is warm, dry and intact. No lesion and no rash noted.  Psychiatric: She has a normal mood and affect.  Nursing note and vitals reviewed.    UC Treatments / Results  Labs (all labs ordered are listed, but only  abnormal results are displayed) Labs Reviewed  POCT URINALYSIS DIP (MANUAL ENTRY) - Abnormal; Notable for the following:       Result Value   Color, UA light yellow (*)    Spec Grav, UA <=1.005 (*)    Blood, UA trace-lysed (*)    All other components within normal limits  URINE CULTURE  URINE CULTURE  EKG  EKG Interpretation None      Dg Lumbar Spine Complete  Result Date: 09/07/2017 CLINICAL DATA:  Right lower back pain with sciatica since yesterday. No known injury. Pain is made worse with bending. EXAM: LUMBAR SPINE - COMPLETE 4+ VIEW COMPARISON:  Coronal and sagittal reconstructed images through the lumbar spine from an abdominal and pelvic CT scan of April 02, 2007. FINDINGS: The lumbar vertebral bodies are preserved in height. The pedicles and transverse processes are intact. There is no spondylolisthesis. There is moderate disc space narrowing at L4-5 and mild narrowing at L3-4. There is no significant facet joint hypertrophy. The observed portions of the sacrum are normal. IMPRESSION: Degenerative disc disease centered at L4-5 and at L3-4. No compression fracture or spondylolisthesis. Electronically Signed   By: David  [place] M.D.   On: 09/07/2017 12:22  Radiology No results found.  Procedures  Procedures (including critical care time)  Medications Ordered in UC Medications  ketorolac (TORADOL) injection 60 mg (60 mg Intramuscular Given 09/07/17 1141)     Initial Impression / Assessment and Plan / UC Course  I have reviewed the triage vital signs and the nursing notes.  Pertinent labs & imaging results that were available during my care of the patient were reviewed by me and considered in my medical decision making (see chart for details).       Final Clinical Impressions(s) / UC Diagnoses   Final diagnoses:  Low back pain  Strain of lumbar region, initial encounter  Urinalysis negative except trace blood. No urinary symptoms. No history of kidney problems.  Clinically, I feel it is unlikely that the pain is from kidney stone, as the pain is constant and exacerbated by movement, which is inconsistent with kidney stone pain. Today, Toradol 60 mg IM.- This helped her pain somewhat. We reviewed x-ray LS-spine, no acute abnormalities although some degenerative disc disease L4-5 and L3-4. Treatment options discussed, as well as risks, benefits, alternatives. Patient voiced understanding and agreement with the following plans: She declined steroids, either IM or by mouth at this time but might consider this if not improving. She'll take OTC Aleve, 2 twice a day after meals. She declined any stronger medicine, and she states she does not tolerate codeine.  New Prescriptions Discharge Medication List as of 09/07/2017 12:56 PM    START taking these medications   Details  cyclobenzaprine (FLEXERIL) 10 MG tablet Take 1 tablet (10 mg total) by mouth at bedtime. For muscle relaxant, Starting Thu 09/07/2017, Normal       Follow-up with your primary care doctor in 5-7 days if not improving, or sooner if symptoms become worse. Precautions discussed. Red flags discussed. Questions invited and answered. Patient voiced understanding and agreement.      Lajean Manes, MD 09/10/17 [phone]

## 2017-09-08 ENCOUNTER — Telehealth: Payer: Self-pay | Admitting: *Deleted

## 2017-09-08 LAB — URINE CULTURE
MICRO NUMBER:: 81103988
Result:: NO GROWTH
SPECIMEN QUALITY:: ADEQUATE

## 2017-09-08 NOTE — Telephone Encounter (Signed)
LM with Ucx results and to call back if she has any questions or concerns.  

## 2017-09-10 ENCOUNTER — Telehealth: Payer: Self-pay | Admitting: Emergency Medicine

## 2017-09-10 NOTE — Telephone Encounter (Signed)
Left message advising that if doing well to disregard this call, if not any better to follow up with PCP.

## 2017-11-07 DIAGNOSIS — L249 Irritant contact dermatitis, unspecified cause: Secondary | ICD-10-CM | POA: Diagnosis not present

## 2017-12-07 DIAGNOSIS — Z1151 Encounter for screening for human papillomavirus (HPV): Secondary | ICD-10-CM | POA: Diagnosis not present

## 2017-12-07 DIAGNOSIS — Z6827 Body mass index (BMI) 27.0-27.9, adult: Secondary | ICD-10-CM | POA: Diagnosis not present

## 2017-12-07 DIAGNOSIS — Z01419 Encounter for gynecological examination (general) (routine) without abnormal findings: Secondary | ICD-10-CM | POA: Diagnosis not present

## 2017-12-27 DIAGNOSIS — M25512 Pain in left shoulder: Secondary | ICD-10-CM | POA: Insufficient documentation

## 2018-02-15 DIAGNOSIS — Z13220 Encounter for screening for lipoid disorders: Secondary | ICD-10-CM | POA: Diagnosis not present

## 2018-02-15 DIAGNOSIS — K219 Gastro-esophageal reflux disease without esophagitis: Secondary | ICD-10-CM | POA: Diagnosis not present

## 2018-02-15 DIAGNOSIS — Z0000 Encounter for general adult medical examination without abnormal findings: Secondary | ICD-10-CM | POA: Diagnosis not present

## 2018-02-15 DIAGNOSIS — J309 Allergic rhinitis, unspecified: Secondary | ICD-10-CM | POA: Diagnosis not present

## 2018-02-15 DIAGNOSIS — F419 Anxiety disorder, unspecified: Secondary | ICD-10-CM | POA: Diagnosis not present

## 2018-02-27 ENCOUNTER — Ambulatory Visit: Payer: Self-pay | Admitting: Podiatry

## 2018-02-27 DIAGNOSIS — Z1231 Encounter for screening mammogram for malignant neoplasm of breast: Secondary | ICD-10-CM | POA: Diagnosis not present

## 2018-03-05 IMAGING — CR DG CHEST 2V
2 series · 2 of 2 positions shown · non-contrast
Comparison: Chest x-ray 04/18/2011.

CLINICAL DATA: 60-year-old female with history of dry cough for 1
month. Anterior chest pressure and tightness. Former smoker.

EXAM:
CHEST  2 VIEW

[w chest pa]
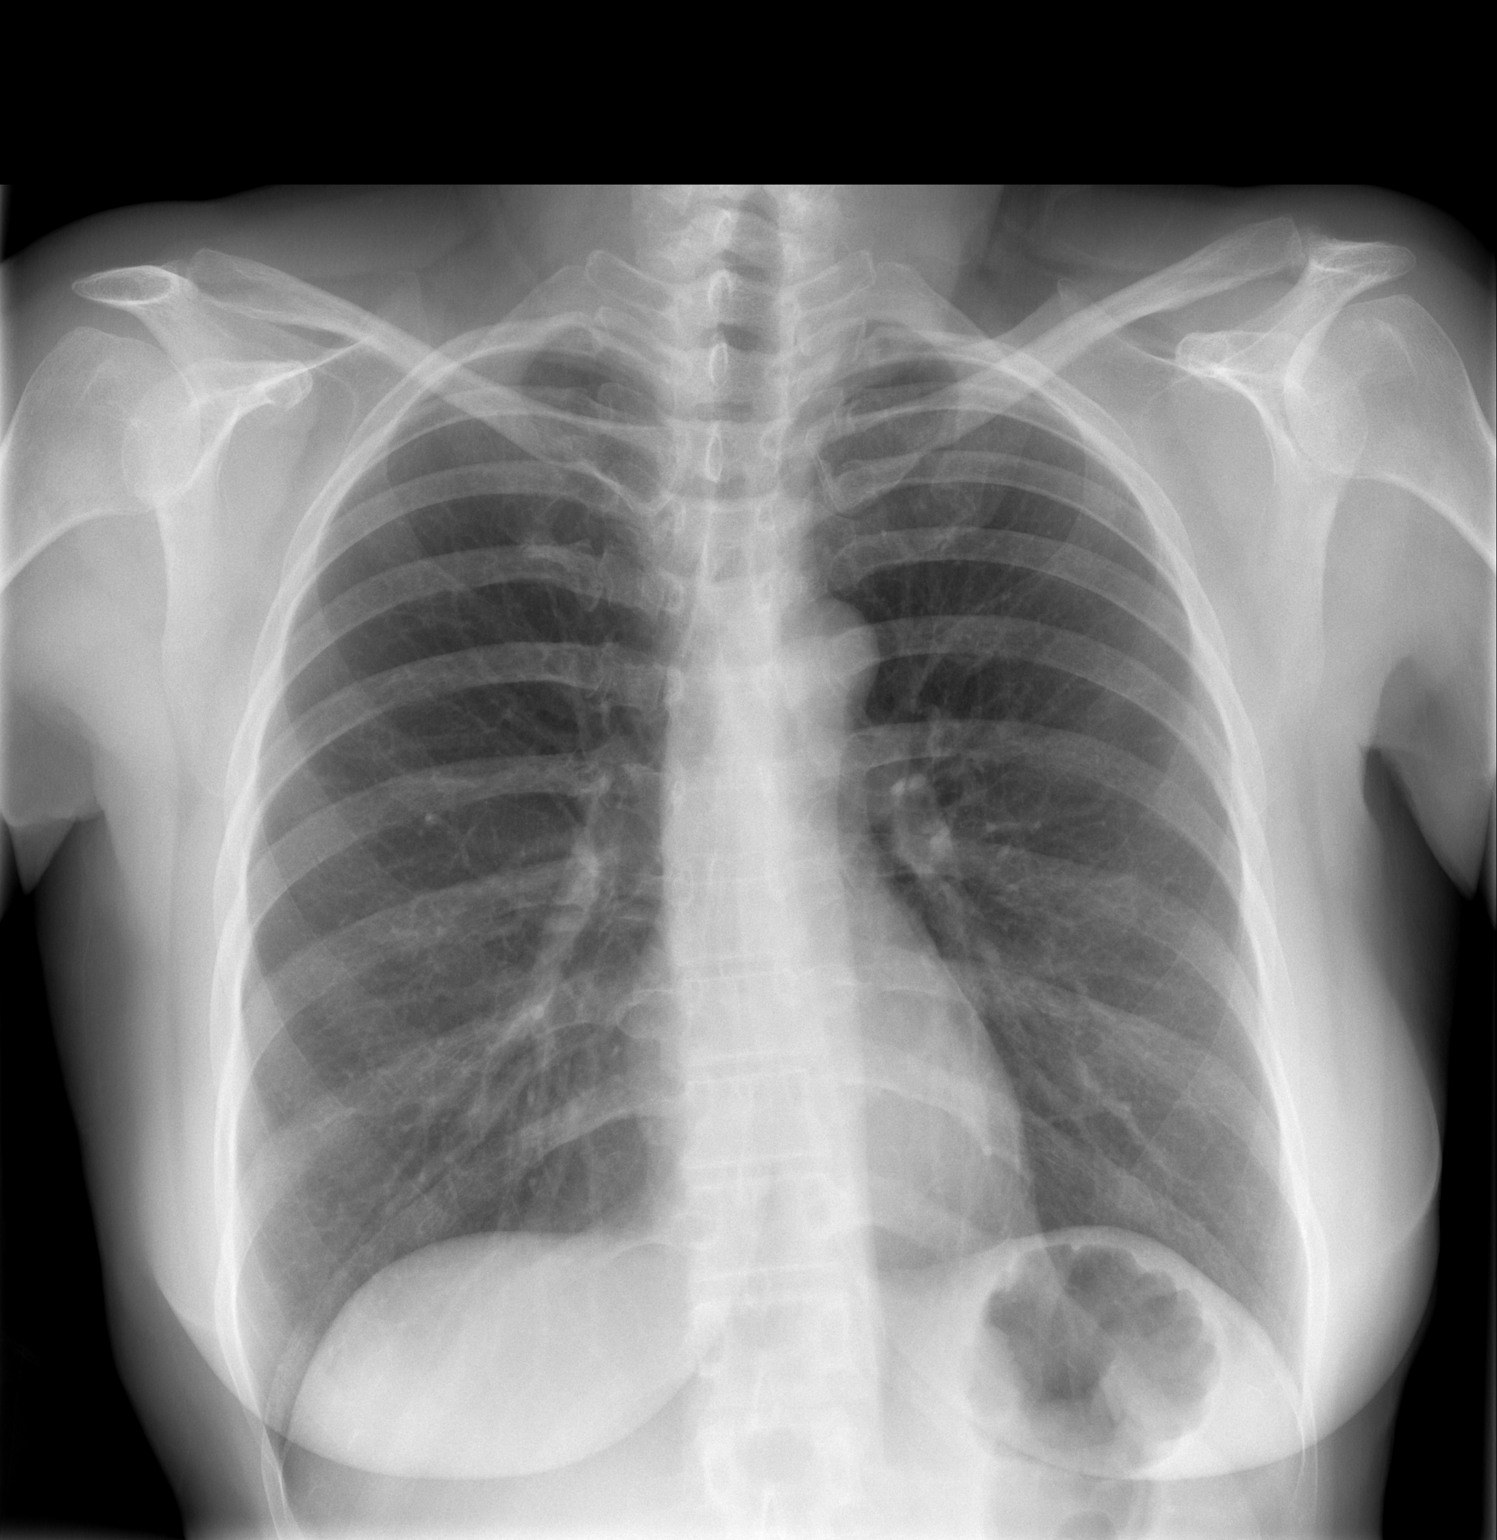

[w chest lat]
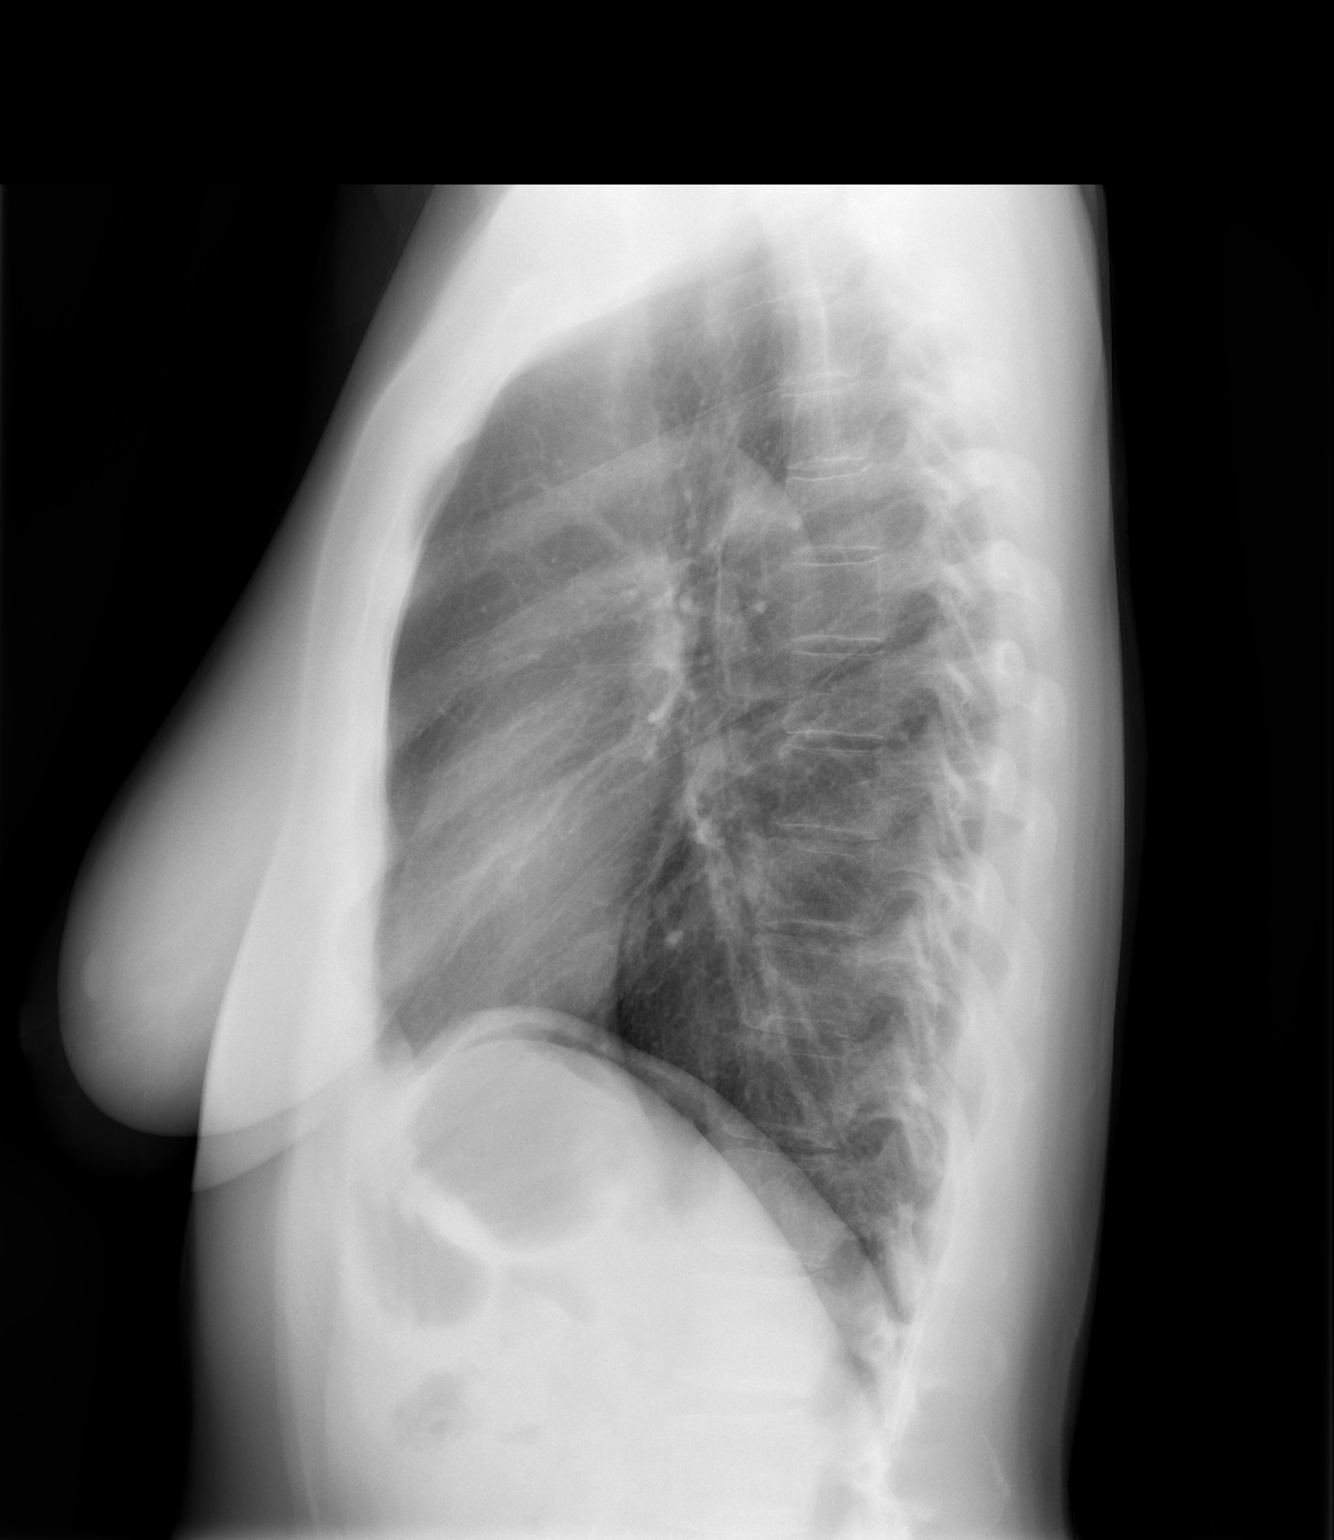

[2 of 2 positions shown; findings below may reference images not displayed]

FINDINGS: Lung volumes are normal. No consolidative airspace disease. No
pleural effusions. No pneumothorax. No pulmonary nodule or mass
noted. Pulmonary vasculature and the cardiomediastinal silhouette
are within normal limits.
IMPRESSION: No radiographic evidence of acute cardiopulmonary disease.

## 2018-03-12 ENCOUNTER — Ambulatory Visit: Payer: Self-pay | Admitting: Podiatry

## 2018-03-15 DIAGNOSIS — D225 Melanocytic nevi of trunk: Secondary | ICD-10-CM | POA: Diagnosis not present

## 2018-03-15 DIAGNOSIS — B078 Other viral warts: Secondary | ICD-10-CM | POA: Diagnosis not present

## 2018-03-15 DIAGNOSIS — L819 Disorder of pigmentation, unspecified: Secondary | ICD-10-CM | POA: Diagnosis not present

## 2018-03-15 DIAGNOSIS — L814 Other melanin hyperpigmentation: Secondary | ICD-10-CM | POA: Diagnosis not present

## 2018-03-15 DIAGNOSIS — D1801 Hemangioma of skin and subcutaneous tissue: Secondary | ICD-10-CM | POA: Diagnosis not present

## 2018-07-10 DIAGNOSIS — Z833 Family history of diabetes mellitus: Secondary | ICD-10-CM | POA: Diagnosis not present

## 2018-07-10 DIAGNOSIS — Z8349 Family history of other endocrine, nutritional and metabolic diseases: Secondary | ICD-10-CM | POA: Diagnosis not present

## 2018-07-10 DIAGNOSIS — R55 Syncope and collapse: Secondary | ICD-10-CM | POA: Diagnosis not present

## 2018-11-07 DIAGNOSIS — B078 Other viral warts: Secondary | ICD-10-CM | POA: Diagnosis not present

## 2018-11-07 DIAGNOSIS — L814 Other melanin hyperpigmentation: Secondary | ICD-10-CM | POA: Diagnosis not present

## 2018-11-07 DIAGNOSIS — D3617 Benign neoplasm of peripheral nerves and autonomic nervous system of trunk, unspecified: Secondary | ICD-10-CM | POA: Diagnosis not present

## 2019-01-21 DIAGNOSIS — Z6827 Body mass index (BMI) 27.0-27.9, adult: Secondary | ICD-10-CM | POA: Diagnosis not present

## 2019-01-21 DIAGNOSIS — Z01419 Encounter for gynecological examination (general) (routine) without abnormal findings: Secondary | ICD-10-CM | POA: Diagnosis not present

## 2019-04-11 ENCOUNTER — Other Ambulatory Visit: Payer: Self-pay | Admitting: Podiatry

## 2019-04-11 ENCOUNTER — Other Ambulatory Visit: Payer: Self-pay

## 2019-04-11 ENCOUNTER — Ambulatory Visit: Payer: BLUE CROSS/BLUE SHIELD | Admitting: Podiatry

## 2019-04-11 ENCOUNTER — Ambulatory Visit (INDEPENDENT_AMBULATORY_CARE_PROVIDER_SITE_OTHER): Payer: BLUE CROSS/BLUE SHIELD

## 2019-04-11 ENCOUNTER — Encounter: Payer: Self-pay | Admitting: Podiatry

## 2019-04-11 VITALS — BP 115/63 | HR 77 | Temp 97.3°F | Resp 16

## 2019-04-11 DIAGNOSIS — M779 Enthesopathy, unspecified: Secondary | ICD-10-CM | POA: Diagnosis not present

## 2019-04-11 DIAGNOSIS — M778 Other enthesopathies, not elsewhere classified: Secondary | ICD-10-CM

## 2019-04-11 DIAGNOSIS — M76811 Anterior tibial syndrome, right leg: Secondary | ICD-10-CM

## 2019-04-11 DIAGNOSIS — M2011 Hallux valgus (acquired), right foot: Secondary | ICD-10-CM

## 2019-04-11 DIAGNOSIS — M2012 Hallux valgus (acquired), left foot: Principal | ICD-10-CM

## 2019-04-11 MED ORDER — MELOXICAM 15 MG PO TABS: 15.0000 mg | ORAL_TABLET | Freq: Every day | ORAL | 3 refills | Status: DC

## 2019-04-11 MED ORDER — METHYLPREDNISOLONE 4 MG PO TBPK: ORAL_TABLET | ORAL | 0 refills | Status: DC

## 2019-04-11 NOTE — Progress Notes (Signed)
  Subjective:  Patient ID: Angelica Hartman, female    DOB: 05/25/1956,  MRN: [id number] HPI Chief Complaint  Patient presents with  . Foot Pain    Dorsal midfoot right - aching x 1 months, no injury, radiates to lateral ankle area, tried icing, heat, ibuprofen, stretching - no help - wears good shoes  . New Patient (Initial Visit)    62 y.o. female presents with the above complaint.   ROS: Denies fever chills nausea vomiting muscle aches pains calf pain back pain chest pain shortness of breath.  Past Medical History:  Diagnosis Date  . History of Helicobacter pylori infection   . Seasonal allergies    Past Surgical History:  Procedure Laterality Date  . ABDOMINAL SURGERY    . BREAST LUMPECTOMY Right     Current Outpatient Medications:  .  cetirizine (ZYRTEC) 10 MG tablet, Take 1 tablet (10 mg total) by mouth daily., Disp: 30 tablet, Rfl: 0 .  FYAVOLV 0.5-2.5 MG-MCG tablet, , Disp: , Rfl:  .  LORazepam (ATIVAN) 0.5 MG tablet, TK 1 T PO QD PRN, Disp: , Rfl:  .  meloxicam (MOBIC) 15 MG tablet, Take 1 tablet (15 mg total) by mouth daily., Disp: 30 tablet, Rfl: 3 .  methylPREDNISolone (MEDROL DOSEPAK) 4 MG TBPK tablet, 6 day dose pack - take as directed, Disp: 21 tablet, Rfl: 0 .  polyethylene glycol (MIRALAX / GLYCOLAX) packet, Take 17 g by mouth daily as needed for mild constipation. , Disp: , Rfl:  .  sertraline (ZOLOFT) 25 MG tablet, Take 1 tablet (25 mg total) by mouth daily. Increase to 2 tablets and 7 days. (Patient taking differently: Take 25 mg by mouth daily. ), Disp: 60 tablet, Rfl: 1  Allergies  Allergen Reactions  . Codeine Nausea Only  . Penicillins Rash   Review of Systems Objective:   Vitals:   04/11/19 0900  BP: 115/63  Pulse: 77  Resp: 16  Temp: (!) 97.3 F (36.3 C)    General: Well developed, nourished, in no acute distress, alert and oriented x3   Dermatological: Skin is warm, dry and supple bilateral. Nails x 10 are well maintained; remaining  integument appears unremarkable at this time. There are no open sores, no preulcerative lesions, no rash or signs of infection present.  Vascular: Dorsalis Pedis artery and Posterior Tibial artery pedal pulses are 2/4 bilateral with immedate capillary fill time. Pedal hair growth present. No varicosities and no lower extremity edema present bilateral.   Neruologic: Grossly intact via light touch bilateral. Vibratory intact via tuning fork bilateral. Protective threshold with Semmes Wienstein monofilament intact to all pedal sites bilateral. Patellar and Achilles deep tendon reflexes 2+ bilateral. No Babinski or clonus noted bilateral.   Musculoskeletal: No gross boney pedal deformities bilateral. No pain, crepitus, or limitation noted with foot and ankle range of motion bilateral. Muscular strength 5/5 in all groups tested bilateral.  Gait: Unassisted, Nonantalgic.    Radiographs:  Radiographs demonstrate no osseous abnormalities no acute findings.  Assessment & Plan:   Assessment: Painful insertion of the tibialis anterior tendon right.  Tendinitis right.  Plan: Discussed etiology pathology and surgical therapies at this point injected the area most painful with 2 mg of dexamethasone local anesthetic start her on a Medrol Dosepak to be followed by meloxicam.  Discussed appropriate shoe gear stretching exercise ice therapy shoe modifications if no improvement in 1 month we will go for an MRI.     Max T. [place], [place]

## 2019-04-17 DIAGNOSIS — F419 Anxiety disorder, unspecified: Secondary | ICD-10-CM | POA: Diagnosis not present

## 2019-04-17 DIAGNOSIS — K219 Gastro-esophageal reflux disease without esophagitis: Secondary | ICD-10-CM | POA: Diagnosis not present

## 2019-05-02 DIAGNOSIS — D225 Melanocytic nevi of trunk: Secondary | ICD-10-CM | POA: Diagnosis not present

## 2019-05-02 DIAGNOSIS — L814 Other melanin hyperpigmentation: Secondary | ICD-10-CM | POA: Diagnosis not present

## 2019-05-02 DIAGNOSIS — L570 Actinic keratosis: Secondary | ICD-10-CM | POA: Diagnosis not present

## 2019-05-02 DIAGNOSIS — D692 Other nonthrombocytopenic purpura: Secondary | ICD-10-CM | POA: Diagnosis not present

## 2019-05-02 DIAGNOSIS — L821 Other seborrheic keratosis: Secondary | ICD-10-CM | POA: Diagnosis not present

## 2019-05-09 ENCOUNTER — Other Ambulatory Visit: Payer: Self-pay

## 2019-05-09 ENCOUNTER — Ambulatory Visit (INDEPENDENT_AMBULATORY_CARE_PROVIDER_SITE_OTHER): Payer: BLUE CROSS/BLUE SHIELD | Admitting: Podiatry

## 2019-05-09 ENCOUNTER — Encounter: Payer: Self-pay | Admitting: Podiatry

## 2019-05-09 ENCOUNTER — Telehealth: Payer: Self-pay | Admitting: *Deleted

## 2019-05-09 VITALS — Temp 97.3°F

## 2019-05-09 DIAGNOSIS — S86219A Strain of muscle(s) and tendon(s) of anterior muscle group at lower leg level, unspecified leg, initial encounter: Secondary | ICD-10-CM

## 2019-05-09 DIAGNOSIS — M76811 Anterior tibial syndrome, right leg: Secondary | ICD-10-CM

## 2019-05-09 NOTE — Progress Notes (Signed)
She presents today for follow-up of her tibialis anterior tendinitis tear or pain at the insertion site.  States that I am ready for an MRI has not gone away yet she states it might be about 50% improved but if I do not take the medication it continues to hurt.  Objective: Vital signs are stable she alert and oriented x3 she has a decrease in edema and swelling at the insertion site the tibialis anterior but there is still some tenderness there.  She has good dorsiflexion and inversion.  Assessment: Tendinitis cannot rule out a partial tear of the tibialis anterior tendinous insertion site.  Plan: At this point were going request an MRI and I recommended that she continue to perform all of her current activities and continue to ice as well as continue the anti-inflammatory.

## 2019-05-09 NOTE — Telephone Encounter (Signed)
-----   Message from Kristian Covey, [facility] sent at 05/09/2019  8:23 AM EDT ----- Regarding: MRI MRI right ankle - evaluate anterior tibialis tendon tear right - surgical consideration

## 2019-05-09 NOTE — Telephone Encounter (Signed)
Faxed orders to [facility]. 

## 2019-05-10 NOTE — Telephone Encounter (Signed)
BCBS eligibility statement to be faxed.

## 2019-05-13 NOTE — Telephone Encounter (Signed)
I called pt and informed that [employer] had stated there was a problem with her eligibility and that I had listened to her eligibility 3 times and received a copy, but did not have a phone number to pre-cert. I gave pt the CPT [id number] for right ankle without contrast, and requested a contact number to pre-cert her MRI and if her insurance company stated a pre-cert was not needed to get a reference number. Pt states understanding.

## 2019-05-13 NOTE — Telephone Encounter (Signed)
[place] with the other [place] I was required to listen to at least one of pt's benefits and eligibility prior to being transferred to a representative, but once the benefits and eligibility had been listened to and representative requested the recording stated at least once benefit and eligibility had to be listened to. I had listened to this recording at [place] [employer] [place] numbers.

## 2019-05-13 NOTE — Telephone Encounter (Signed)
Angelica Hartman states question eligibility, contact Customer Service to check eligibility.

## 2019-05-14 NOTE — Telephone Encounter (Addendum)
I called pt, and asked that she have her insurance company fax Confirmation/Authorization for the MRI to [phone] or my email. Pt states she is scheduled for 05/29/2019/

## 2019-05-14 NOTE — Telephone Encounter (Signed)
Pt states she received a call from [place] that everything was okay, was that from our office or her insurance.

## 2019-05-21 NOTE — Telephone Encounter (Signed)
Pt email question if she was okayed for the MRI on Monday. I spoke with [place] and was told pt's MRI was approved without PA needed just co-pay.

## 2019-05-21 NOTE — Telephone Encounter (Signed)
I emailed J. Bryant's response to pt.

## 2019-05-27 ENCOUNTER — Other Ambulatory Visit: Payer: Self-pay

## 2019-05-27 ENCOUNTER — Ambulatory Visit
Admission: RE | Admit: 2019-05-27 | Discharge: 2019-05-27 | Disposition: A | Discharge status: Home / Self Care | Payer: BLUE CROSS/BLUE SHIELD | Source: Ambulatory Visit | Attending: Podiatry | Admitting: Podiatry

## 2019-05-27 DIAGNOSIS — S86219A Strain of muscle(s) and tendon(s) of anterior muscle group at lower leg level, unspecified leg, initial encounter: Secondary | ICD-10-CM

## 2019-05-27 DIAGNOSIS — M76811 Anterior tibial syndrome, right leg: Secondary | ICD-10-CM

## 2019-05-27 DIAGNOSIS — M19071 Primary osteoarthritis, right ankle and foot: Secondary | ICD-10-CM | POA: Diagnosis not present

## 2019-05-28 ENCOUNTER — Telehealth: Payer: Self-pay | Admitting: *Deleted

## 2019-05-28 NOTE — Telephone Encounter (Signed)
-----   Message from Garrel Ridgel, [place] sent at 05/27/2019  1:20 PM EDT ----- Send for over read and inform patient of the delay

## 2019-05-28 NOTE — Telephone Encounter (Deleted)
-----   Message from Max T Hyatt, DPM sent at 05/27/2019  1:20 PM EDT ----- Send for over read and inform patient of the delay 

## 2019-05-28 NOTE — Telephone Encounter (Signed)
I informed pt of Dr. Stephenie Acres request to send copy of MRI disc to a radiology specialist for more details to plan treatment, there would be a 10-14 delay in final results, we would call to once results were received. Pt states understanding.

## 2019-05-29 ENCOUNTER — Other Ambulatory Visit: Payer: BLUE CROSS/BLUE SHIELD

## 2019-05-30 NOTE — Telephone Encounter (Signed)
MRI disc was sent out to [facility] today. Will notify patient once results are received. Appointment will be needed at that time to discuss MRI reports.

## 2019-06-03 DIAGNOSIS — R05 Cough: Secondary | ICD-10-CM | POA: Diagnosis not present

## 2019-06-03 DIAGNOSIS — K219 Gastro-esophageal reflux disease without esophagitis: Secondary | ICD-10-CM | POA: Diagnosis not present

## 2019-06-03 DIAGNOSIS — R21 Rash and other nonspecific skin eruption: Secondary | ICD-10-CM | POA: Diagnosis not present

## 2019-06-03 DIAGNOSIS — R0602 Shortness of breath: Secondary | ICD-10-CM | POA: Diagnosis not present

## 2019-06-11 ENCOUNTER — Other Ambulatory Visit: Payer: Self-pay | Admitting: Physician Assistant

## 2019-06-11 ENCOUNTER — Ambulatory Visit
Admission: RE | Admit: 2019-06-11 | Discharge: 2019-06-11 | Disposition: A | Discharge status: Home / Self Care | Payer: BC Managed Care – PPO | Source: Ambulatory Visit | Attending: Physician Assistant | Admitting: Physician Assistant

## 2019-06-11 DIAGNOSIS — R05 Cough: Secondary | ICD-10-CM

## 2019-06-11 DIAGNOSIS — R059 Cough, unspecified: Secondary | ICD-10-CM

## 2019-06-14 ENCOUNTER — Encounter: Payer: Self-pay | Admitting: Podiatry

## 2019-06-20 ENCOUNTER — Telehealth: Payer: Self-pay | Admitting: *Deleted

## 2019-06-20 NOTE — Telephone Encounter (Signed)
I informed pt of Dr. Stephenie Acres review of over read results. Pt states she is not going to do PT, just not the type to show up regularly for PT, but will think about coming in for another shot. Pt states it does hurt everyday, but she is continuing to ice and move it around and she will think about the appt.

## 2019-06-20 NOTE — Telephone Encounter (Signed)
Tibialis tendon is intact but does demonstrate inflammation.  She may benefit from PT since there is no tear in that tendon.  I will be happy to see her and go over all of her results.. If she agrees to PT then set that up for her please.

## 2019-06-20 NOTE — Telephone Encounter (Signed)
Pt called states she had a MRI and she is confused and feels she has been forgotten.

## 2019-06-25 DIAGNOSIS — R109 Unspecified abdominal pain: Secondary | ICD-10-CM | POA: Diagnosis not present

## 2019-07-01 DIAGNOSIS — Z1231 Encounter for screening mammogram for malignant neoplasm of breast: Secondary | ICD-10-CM | POA: Diagnosis not present

## 2019-07-05 DIAGNOSIS — Z0000 Encounter for general adult medical examination without abnormal findings: Secondary | ICD-10-CM | POA: Diagnosis not present

## 2019-07-05 DIAGNOSIS — F419 Anxiety disorder, unspecified: Secondary | ICD-10-CM | POA: Diagnosis not present

## 2019-07-05 DIAGNOSIS — J309 Allergic rhinitis, unspecified: Secondary | ICD-10-CM | POA: Diagnosis not present

## 2019-07-05 DIAGNOSIS — K219 Gastro-esophageal reflux disease without esophagitis: Secondary | ICD-10-CM | POA: Diagnosis not present

## 2019-07-09 DIAGNOSIS — H43811 Vitreous degeneration, right eye: Secondary | ICD-10-CM | POA: Diagnosis not present

## 2019-07-10 DIAGNOSIS — Z23 Encounter for immunization: Secondary | ICD-10-CM | POA: Diagnosis not present

## 2019-07-10 DIAGNOSIS — Z13220 Encounter for screening for lipoid disorders: Secondary | ICD-10-CM | POA: Diagnosis not present

## 2019-07-10 DIAGNOSIS — Z0000 Encounter for general adult medical examination without abnormal findings: Secondary | ICD-10-CM | POA: Diagnosis not present

## 2019-07-18 ENCOUNTER — Ambulatory Visit: Payer: BLUE CROSS/BLUE SHIELD | Admitting: Podiatry

## 2019-07-25 ENCOUNTER — Ambulatory Visit: Payer: BLUE CROSS/BLUE SHIELD | Admitting: Podiatry

## 2019-07-25 ENCOUNTER — Encounter: Payer: Self-pay | Admitting: Podiatry

## 2019-07-25 ENCOUNTER — Other Ambulatory Visit: Payer: Self-pay

## 2019-07-25 VITALS — Temp 98.0°F

## 2019-07-25 DIAGNOSIS — M722 Plantar fascial fibromatosis: Secondary | ICD-10-CM

## 2019-07-25 DIAGNOSIS — M778 Other enthesopathies, not elsewhere classified: Secondary | ICD-10-CM

## 2019-07-25 DIAGNOSIS — M779 Enthesopathy, unspecified: Secondary | ICD-10-CM | POA: Diagnosis not present

## 2019-07-25 NOTE — Progress Notes (Signed)
She presents with a chief complaint of pain in the right foot today on the dorsal medial aspect overlying the tibial ASL anterior tendon insertion site.  She has been treating it with ice and topical pain relievers and supportive shoes it feels like a pinched nerve on the top of her foot she says.  I have not been able to walk in 2 weeks and not been resting it but is still not had relief in the pain.  Left foot is started to have some burning on the lateral aspect of the foot and the medial arch left.  Objective: Vital signs are stable alert oriented x3.  Pulses are palpable.  Neurologic sensorium is intact dT reflexes are intact.  MRI suggests a arthritic naviculocuneiform joint beneath the insertion of the tibialis anterior tendon right foot.  Left foot most likely some type of lateral compensatory syndrome states that she does have pain on palpation medial calcaneal tubercle of the left heel.  Assessment: Plantar fasciitis lateral compensatory syndrome left.  Osteoarthritis navicular cuneiform joint.  Plan: Discussed etiology pathology conservative surgical therapies at this point time went ahead and injected 20 mg Kenalog 5 mg Marcaine through a 30-gauge needle into the navicular cuneiform joint.  She tolerated this procedure well also injected 20 mg Kenalog 5 mg Marcaine for maximal tenderness of the left heel.  I will send her to [facility] for set of orthotics to be fabricated.

## 2019-08-02 ENCOUNTER — Telehealth: Payer: Self-pay | Admitting: Podiatry

## 2019-08-02 NOTE — Telephone Encounter (Signed)
Called pt with orthotic benefit coverage and they are covered @ 50% coinsurance. Pt is aware and wants to hold off for now. She is aware we can set up payment plan if/ when she proceeds. She is also aware of [employer].

## 2019-08-08 ENCOUNTER — Other Ambulatory Visit: Payer: BC Managed Care – PPO | Admitting: Orthotics

## 2019-08-19 ENCOUNTER — Other Ambulatory Visit: Payer: Self-pay

## 2019-08-19 ENCOUNTER — Emergency Department (HOSPITAL_COMMUNITY): Payer: BC Managed Care – PPO

## 2019-08-19 ENCOUNTER — Emergency Department (HOSPITAL_COMMUNITY)
Admission: EM | Admit: 2019-08-19 | Discharge: 2019-08-19 | Disposition: A | Discharge status: Home / Self Care | Payer: BC Managed Care – PPO | Attending: Emergency Medicine | Admitting: Emergency Medicine

## 2019-08-19 ENCOUNTER — Encounter (HOSPITAL_COMMUNITY): Payer: Self-pay

## 2019-08-19 DIAGNOSIS — Z885 Allergy status to narcotic agent status: Secondary | ICD-10-CM | POA: Insufficient documentation

## 2019-08-19 DIAGNOSIS — Z79899 Other long term (current) drug therapy: Secondary | ICD-10-CM | POA: Insufficient documentation

## 2019-08-19 DIAGNOSIS — Z87891 Personal history of nicotine dependence: Secondary | ICD-10-CM | POA: Diagnosis not present

## 2019-08-19 DIAGNOSIS — R791 Abnormal coagulation profile: Secondary | ICD-10-CM | POA: Diagnosis not present

## 2019-08-19 DIAGNOSIS — R42 Dizziness and giddiness: Secondary | ICD-10-CM | POA: Diagnosis not present

## 2019-08-19 DIAGNOSIS — H532 Diplopia: Secondary | ICD-10-CM | POA: Insufficient documentation

## 2019-08-19 DIAGNOSIS — R531 Weakness: Secondary | ICD-10-CM | POA: Diagnosis not present

## 2019-08-19 DIAGNOSIS — E785 Hyperlipidemia, unspecified: Secondary | ICD-10-CM | POA: Diagnosis not present

## 2019-08-19 DIAGNOSIS — Z880 Allergy status to penicillin: Secondary | ICD-10-CM | POA: Diagnosis not present

## 2019-08-19 LAB — URINALYSIS, ROUTINE W REFLEX MICROSCOPIC
Bilirubin Urine: NEGATIVE
Glucose, UA: NEGATIVE mg/dL
Ketones, ur: NEGATIVE mg/dL
Leukocytes,Ua: NEGATIVE
Nitrite: NEGATIVE
Protein, ur: NEGATIVE mg/dL
Specific Gravity, Urine: 1.005 (ref 1.005–1.030)
pH: 5 (ref 5.0–8.0)

## 2019-08-19 LAB — I-STAT CHEM 8, ED
BUN: 15 mg/dL (ref 8–23)
Calcium, Ion: 1.14 mmol/L — ABNORMAL LOW (ref 1.15–1.40)
Chloride: 104 mmol/L (ref 98–111)
Creatinine, Ser: 0.9 mg/dL (ref 0.44–1.00)
Glucose, Bld: 86 mg/dL (ref 70–99)
HCT: 43 % (ref 36.0–46.0)
Hemoglobin: 14.6 g/dL (ref 12.0–15.0)
Potassium: 3.5 mmol/L (ref 3.5–5.1)
Sodium: 140 mmol/L (ref 135–145)
TCO2: 23 mmol/L (ref 22–32)

## 2019-08-19 LAB — DIFFERENTIAL
Abs Immature Granulocytes: 0.01 10*3/uL (ref 0.00–0.07)
Basophils Absolute: 0 10*3/uL (ref 0.0–0.1)
Basophils Relative: 0 %
Eosinophils Absolute: 0.1 10*3/uL (ref 0.0–0.5)
Eosinophils Relative: 1 %
Immature Granulocytes: 0 %
Lymphocytes Relative: 45 %
Lymphs Abs: 3 10*3/uL (ref 0.7–4.0)
Monocytes Absolute: 0.5 10*3/uL (ref 0.1–1.0)
Monocytes Relative: 7 %
Neutro Abs: 3.1 10*3/uL (ref 1.7–7.7)
Neutrophils Relative %: 47 %

## 2019-08-19 LAB — COMPREHENSIVE METABOLIC PANEL
ALT: 18 U/L (ref 0–44)
AST: 22 U/L (ref 15–41)
Albumin: 4.7 g/dL (ref 3.5–5.0)
Alkaline Phosphatase: 46 U/L (ref 38–126)
Anion gap: 11 (ref 5–15)
BUN: 15 mg/dL (ref 8–23)
CO2: 23 mmol/L (ref 22–32)
Calcium: 9.2 mg/dL (ref 8.9–10.3)
Chloride: 105 mmol/L (ref 98–111)
Creatinine, Ser: 0.91 mg/dL (ref 0.44–1.00)
GFR calc Af Amer: >60 mL/min (ref >60)
GFR calc non Af Amer: >60 mL/min (ref >60)
Glucose, Bld: 92 mg/dL (ref 70–99)
Potassium: 3.6 mmol/L (ref 3.5–5.1)
Sodium: 139 mmol/L (ref 135–145)
Total Bilirubin: 0.3 mg/dL (ref 0.3–1.2)
Total Protein: 7.7 g/dL (ref 6.5–8.1)

## 2019-08-19 LAB — PROTIME-INR
INR: 1 (ref 0.8–1.2)
Prothrombin Time: 13.4 seconds (ref 11.4–15.2)

## 2019-08-19 LAB — APTT: aPTT: 23 seconds — ABNORMAL LOW (ref 24–36)

## 2019-08-19 LAB — RAPID URINE DRUG SCREEN, HOSP PERFORMED
Amphetamines: NOT DETECTED
Barbiturates: NOT DETECTED
Benzodiazepines: NOT DETECTED
Cocaine: NOT DETECTED
Opiates: NOT DETECTED
Tetrahydrocannabinol: NOT DETECTED

## 2019-08-19 LAB — CBC
HCT: 41.7 % (ref 36.0–46.0)
Hemoglobin: 13.9 g/dL (ref 12.0–15.0)
MCH: 32 pg (ref 26.0–34.0)
MCHC: 33.3 g/dL (ref 30.0–36.0)
MCV: 95.9 fL (ref 80.0–100.0)
Platelets: 220 10*3/uL (ref 150–400)
RBC: 4.35 MIL/uL (ref 3.87–5.11)
RDW: 13.2 % (ref 11.5–15.5)
WBC: 6.8 10*3/uL (ref 4.0–10.5)
nRBC: 0 % (ref 0.0–0.2)

## 2019-08-19 NOTE — ED Triage Notes (Signed)
Pt reports double vision and dizziness at 530pm and stated that she laid down and started feeling better. Pt reports not having any symptoms after that. Pt states that she called her PCP and they told her to come here.

## 2019-08-19 NOTE — ED Notes (Signed)
Urine culture sent with specimen 

## 2019-08-19 NOTE — Discharge Instructions (Signed)
Follow up with your primary care provider and eye doctor for further evaluation. Return to ED for worsening symptoms, numbness in arms or legs blurry vision, neck stiffness.

## 2019-08-19 NOTE — ED Provider Notes (Signed)
[facility]-EMERGENCY DEPT Provider Note   CSN: [id number] Arrival date & time: 08/19/19  1830     History   Chief Complaint Chief Complaint  Patient presents with   Weakness    HPI Angelica Hartman is a 63 y.o. female with a past medical history of HLD diagnosed in July 2020, seasonal allergies presents to ED for evaluation of brief episode of diplopia that lasted about 1 minute approximately 1.5 hours ago and resolved completely.  She states that symptoms were bilateral without any specific aggravating or alleviating factor.  She was unable to discern or test for improvement with covering one eye.  She denies history of similar symptoms in the past.  States that she does not have any numbness, headache, vomiting, injuries or falls, fever, shortness of breath or other symptoms.  No history of CVA in the past.  She reports compliance with medication for hyperlipidemia which was diagnosed about 2 months ago.  Denies any changes to gait, neck stiffness, sick contacts with similar symptoms. She states that she will intermittently get migraines about 1-2 times a month which resolved without intervention.  She did have a complete checkup by her ophthalmologist within the past 3 months after experiencing floaters in 1 of her eyes.  States that the checkup was unremarkable.     HPI  Past Medical History:  Diagnosis Date   History of Helicobacter pylori infection    Seasonal allergies     Patient Active Problem List   Diagnosis Date Noted   Pain in joint of left shoulder 12/27/2017    Past Surgical History:  Procedure Laterality Date   ABDOMINAL SURGERY     BREAST LUMPECTOMY Right      OB History   No obstetric history on file.      Home Medications    Prior to Admission medications   Medication Sig Start Date End Date Taking? Authorizing Provider  atorvastatin (LIPITOR) 10 MG tablet Take 10 mg by mouth daily. 07/22/19  Yes [provider]    cetirizine (ZYRTEC) 10 MG tablet Take 1 tablet (10 mg total) by mouth daily. 03/07/16  Yes Antony MaduraHumes, Kelly, PA-C  dicyclomine (BENTYL) 20 MG tablet Take 20 mg by mouth 3 (three) times daily as needed for muscle spasms. 06/25/19  Yes [provider]  FYAVOLV 0.5-2.5 MG-MCG tablet  04/10/19  Yes [provider]  LORazepam (ATIVAN) 0.5 MG tablet Take 0.5 mg by mouth 2 (two) times daily as needed for anxiety.  02/18/19  Yes [provider]  pantoprazole (PROTONIX) 40 MG tablet TK 1 T PO QD PRN 04/12/19  Yes [provider]  polyethylene glycol (MIRALAX / GLYCOLAX) packet Take 17 g by mouth every other day.    Yes [provider]  sertraline (ZOLOFT) 25 MG tablet Take 1 tablet (25 mg total) by mouth daily. Increase to 2 tablets and 7 days. Patient taking differently: Take 25 mg by mouth daily.  08/19/14  Yes Breeback, Jade L, PA-C  meloxicam (MOBIC) 15 MG tablet Take 1 tablet (15 mg total) by mouth daily. Patient not taking: Reported on 08/19/2019 04/11/19   Elinor ParkinsonHyatt, Max T, DPM    Family History Family History  Problem Relation Age of Onset   Alzheimer's disease Mother     Social History Social History   Tobacco Use   Smoking status: Former Smoker   Smokeless tobacco: Never Used  Substance Use Topics   Alcohol use: No   Drug use: No  Allergies   Codeine and Penicillins   Review of Systems Review of Systems  Constitutional: Negative for appetite change, chills and fever.  HENT: Negative for ear pain, rhinorrhea, sneezing and sore throat.   Eyes: Positive for visual disturbance (resolved). Negative for photophobia.  Respiratory: Negative for cough, chest tightness, shortness of breath and wheezing.   Cardiovascular: Negative for chest pain and palpitations.  Gastrointestinal: Negative for abdominal pain, blood in stool, constipation, diarrhea, nausea and vomiting.  Genitourinary: Negative for dysuria, hematuria and urgency.  Musculoskeletal:  Negative for myalgias.  Skin: Negative for rash.  Neurological: Negative for dizziness, tremors, facial asymmetry, speech difficulty, weakness, light-headedness, numbness and headaches.     Physical Exam Updated Vital Signs BP (!) 142/97 (BP Location: Left Arm)    Pulse 62    Temp 98.3 F (36.8 C) (Oral)    Resp 18    Ht 5\' 1"  (1.549 m)    Wt 67.1 kg    SpO2 100%    BMI 27.96 kg/m   Physical Exam Vitals signs and nursing note reviewed.  Constitutional:      General: She is not in acute distress.    Appearance: She is well-developed.  HENT:     Head: Normocephalic and atraumatic.     Nose: Nose normal.  Eyes:     General: No scleral icterus.       Right eye: No discharge.        Left eye: No discharge.     Conjunctiva/sclera: Conjunctivae normal.     Pupils: Pupils are equal, round, and reactive to light.  Neck:     Musculoskeletal: Normal range of motion and neck supple.  Cardiovascular:     Rate and Rhythm: Normal rate and regular rhythm.     Heart sounds: Normal heart sounds. No murmur. No friction rub. No gallop.   Pulmonary:     Effort: Pulmonary effort is normal. No respiratory distress.     Breath sounds: Normal breath sounds.  Abdominal:     General: Bowel sounds are normal. There is no distension.     Palpations: Abdomen is soft.     Tenderness: There is no abdominal tenderness. There is no guarding.  Musculoskeletal: Normal range of motion.  Skin:    General: Skin is warm and dry.     Findings: No rash.  Neurological:     General: No focal deficit present.     Mental Status: She is alert and oriented to person, place, and time.     Cranial Nerves: No cranial nerve deficit.     Sensory: No sensory deficit.     Motor: No weakness or abnormal muscle tone.     Coordination: Coordination normal.     Comments: Pupils reactive. No facial asymmetry noted. Cranial nerves appear grossly intact. Symmetrical tongue protrusion. Sensation intact to light touch on face, BUE  and BLE. Strength 5/5 in BUE and BLE. Normal finger to nose coordination bilaterally. AAOx4.      ED Treatments / Results  Labs (all labs ordered are listed, but only abnormal results are displayed) Labs Reviewed  APTT - Abnormal; Notable for the following components:      Result Value   aPTT 23 (*)    All other components within normal limits  URINALYSIS, ROUTINE W REFLEX MICROSCOPIC - Abnormal; Notable for the following components:   Color, Urine STRAW (*)    Hgb urine dipstick SMALL (*)    Bacteria, UA RARE (*)    All  other components within normal limits  I-STAT CHEM 8, ED - Abnormal; Notable for the following components:   Calcium, Ion 1.14 (*)    All other components within normal limits  PROTIME-INR  CBC  DIFFERENTIAL  COMPREHENSIVE METABOLIC PANEL  RAPID URINE DRUG SCREEN, HOSP PERFORMED  ETHANOL    EKG None  Radiology Ct Head Wo Contrast  Result Date: 08/19/2019 CLINICAL DATA:  Dizziness and double vision for a few hours EXAM: CT HEAD WITHOUT CONTRAST TECHNIQUE: Contiguous axial images were obtained from the base of the skull through the vertex without intravenous contrast. COMPARISON:  None. FINDINGS: Brain: No evidence of acute infarction, hemorrhage, hydrocephalus, extra-axial collection or mass lesion/mass effect. Basal ganglia calcifications are noted. Vascular: No hyperdense vessel or unexpected calcification. Skull: Normal. Negative for fracture or focal lesion. Sinuses/Orbits: No acute finding. Other: None. IMPRESSION: No acute abnormality noted. Electronically Signed   By: Alcide CleverMark  Lukens M.D.   On: 08/19/2019 19:54   Mr Brain Wo Contrast  Result Date: 08/19/2019 CLINICAL DATA:  Resolved diplopia and dizziness. EXAM: MRI HEAD WITHOUT CONTRAST TECHNIQUE: Multiplanar, multiecho pulse sequences of the brain and surrounding structures were obtained without intravenous contrast. COMPARISON:  Head CT 08/19/2019 and MRI 04/29/2014 FINDINGS: The study is mildly motion  degraded. Brain: There is no evidence of acute infarct, intracranial hemorrhage, mass, midline shift, or extra-axial fluid collection. The ventricles and sulci are normal. No significant white matter disease is evident. There is a nonexpanded partially empty sella. Vascular: The left vertebral artery is hypoplastic. Other major intracranial vascular flow voids are preserved. Skull and upper cervical spine: Unremarkable bone marrow signal. Sinuses/Orbits: Unremarkable orbits. Paranasal sinuses and mastoid air cells are clear. Other: None. IMPRESSION: 1. No acute intracranial abnormality. 2. Partially empty sella, likely incidental. Electronically Signed   By: Sebastian AcheAllen  Grady M.D.   On: 08/19/2019 21:46    Procedures Procedures (including critical care time)  Medications Ordered in ED Medications - No data to display   Initial Impression / Assessment and Plan / ED Course  I have reviewed the triage vital signs and the nursing notes.  Pertinent labs & imaging results that were available during my care of the patient were reviewed by me and considered in my medical decision making (see chart for details).        63 year old female presents to ED for 1 minute episode of bilateral diplopia that began just prior to arrival.  States that her symptoms gradually resolved without intervention after laying down.  She was sent to the ED from her doctor's office for imaging of her brain.  Patient denies history of similar symptoms in the past.  States that she saw her eye doctor in the past several months had a complete work-up done which was reassuring.  She appears overall well on my exam.  No deficits to neurological exam noted.  No facial asymmetry, changes to sensation or weakness noted.  Vital signs within normal limits.  Lab work including CBC, CMP, urinalysis and coag labs are unremarkable.  CT of the head is negative.  MRI of the brain is negative for acute abnormality.  Patient has remained in the ED for  4 hours with no recurrence of her symptoms.  She is requesting discharge home and is comfortable with this plan. No signs of CVA on workup, low suspicion for neurological cause as her symptoms were bilateral, have resolved without any deficits. We will have her follow-up with her eye doctor, PCP and return to ED for worsening  symptoms.  Patient is hemodynamically stable, in NAD, and able to ambulate in the ED. Evaluation does not show pathology that would require ongoing emergent intervention or inpatient treatment. I explained the diagnosis to the patient. Pain has been managed and has no complaints prior to discharge. Patient is comfortable with above plan and is stable for discharge at this time. All questions were answered prior to disposition. Strict return precautions for returning to the ED were discussed. Encouraged follow up with PCP.   An After Visit Summary was printed and given to the patient.   Portions of this note were generated with Scientist, clinical (histocompatibility and immunogenetics). Dictation errors may occur despite best attempts at proofreading.  Final Clinical Impressions(s) / ED Diagnoses   Final diagnoses:  Transient diplopia    ED Discharge Orders    None       Dietrich Pates, PA-C 08/19/19 2219    Arby Barrette, MD 08/22/19 1418

## 2019-08-19 NOTE — ED Notes (Signed)
Patient transported to MRI 

## 2019-08-19 NOTE — ED Notes (Signed)
Patient transported to CT 

## 2019-08-22 ENCOUNTER — Ambulatory Visit: Payer: BC Managed Care – PPO | Admitting: Podiatry

## 2019-08-26 DIAGNOSIS — H2513 Age-related nuclear cataract, bilateral: Secondary | ICD-10-CM | POA: Diagnosis not present

## 2019-08-26 DIAGNOSIS — G43909 Migraine, unspecified, not intractable, without status migrainosus: Secondary | ICD-10-CM | POA: Diagnosis not present

## 2019-08-26 DIAGNOSIS — H5213 Myopia, bilateral: Secondary | ICD-10-CM | POA: Diagnosis not present

## 2019-08-26 DIAGNOSIS — H43811 Vitreous degeneration, right eye: Secondary | ICD-10-CM | POA: Diagnosis not present

## 2019-10-16 DIAGNOSIS — L570 Actinic keratosis: Secondary | ICD-10-CM | POA: Diagnosis not present

## 2019-10-24 DIAGNOSIS — H5010 Unspecified exotropia: Secondary | ICD-10-CM | POA: Diagnosis not present

## 2019-10-24 DIAGNOSIS — H532 Diplopia: Secondary | ICD-10-CM | POA: Diagnosis not present

## 2019-10-30 DIAGNOSIS — H532 Diplopia: Secondary | ICD-10-CM | POA: Diagnosis not present

## 2019-10-30 DIAGNOSIS — L298 Other pruritus: Secondary | ICD-10-CM | POA: Diagnosis not present

## 2019-11-11 DIAGNOSIS — E7800 Pure hypercholesterolemia, unspecified: Secondary | ICD-10-CM | POA: Diagnosis not present

## 2019-11-11 DIAGNOSIS — Z5181 Encounter for therapeutic drug level monitoring: Secondary | ICD-10-CM | POA: Diagnosis not present

## 2019-12-17 DIAGNOSIS — M5136 Other intervertebral disc degeneration, lumbar region: Secondary | ICD-10-CM | POA: Diagnosis not present

## 2019-12-17 DIAGNOSIS — G5713 Meralgia paresthetica, bilateral lower limbs: Secondary | ICD-10-CM | POA: Diagnosis not present

## 2019-12-17 DIAGNOSIS — M549 Dorsalgia, unspecified: Secondary | ICD-10-CM | POA: Diagnosis not present

## 2019-12-17 DIAGNOSIS — M47816 Spondylosis without myelopathy or radiculopathy, lumbar region: Secondary | ICD-10-CM | POA: Diagnosis not present

## 2020-01-13 ENCOUNTER — Telehealth: Payer: Self-pay | Admitting: Podiatry

## 2020-01-13 NOTE — Telephone Encounter (Signed)
I saw Dr. Al Corpus last year and need a copy of my x-rays and notes to give to my chiropractor. Please call me and let me know if I can come by today or tomorrow to pick those up. Thank you.

## 2020-01-13 NOTE — Telephone Encounter (Signed)
Called pt to let her know that her requested medical records were ready to be picked up, but that she would need to fill out and sign a medical records release form. Pt stated she would be here this afternoon to fill out and sign the form to obtain her requested medical records.

## 2020-01-15 DIAGNOSIS — M5431 Sciatica, right side: Secondary | ICD-10-CM | POA: Diagnosis not present

## 2020-01-15 DIAGNOSIS — M9905 Segmental and somatic dysfunction of pelvic region: Secondary | ICD-10-CM | POA: Diagnosis not present

## 2020-01-15 DIAGNOSIS — M9903 Segmental and somatic dysfunction of lumbar region: Secondary | ICD-10-CM | POA: Diagnosis not present

## 2020-01-15 DIAGNOSIS — M5386 Other specified dorsopathies, lumbar region: Secondary | ICD-10-CM | POA: Diagnosis not present

## 2020-01-16 ENCOUNTER — Telehealth: Payer: Self-pay

## 2020-01-16 NOTE — Telephone Encounter (Signed)
Patient called and left a message - states she chose to NOT get orthotics back in August and now thinks she should get them. Does she need another appointment with Dr. Al Corpus or does she just need an appointment with Raiford Noble or [facility]? Please call to advise

## 2020-01-16 NOTE — Telephone Encounter (Signed)
Can you please schedule this patient an appointment with Raiford Noble or [facility] for orthotic scan. Thanks!

## 2020-01-20 DIAGNOSIS — M5431 Sciatica, right side: Secondary | ICD-10-CM | POA: Diagnosis not present

## 2020-01-20 DIAGNOSIS — M5386 Other specified dorsopathies, lumbar region: Secondary | ICD-10-CM | POA: Diagnosis not present

## 2020-01-20 DIAGNOSIS — M9905 Segmental and somatic dysfunction of pelvic region: Secondary | ICD-10-CM | POA: Diagnosis not present

## 2020-01-20 DIAGNOSIS — M9903 Segmental and somatic dysfunction of lumbar region: Secondary | ICD-10-CM | POA: Diagnosis not present

## 2020-01-22 DIAGNOSIS — M9905 Segmental and somatic dysfunction of pelvic region: Secondary | ICD-10-CM | POA: Diagnosis not present

## 2020-01-22 DIAGNOSIS — M5386 Other specified dorsopathies, lumbar region: Secondary | ICD-10-CM | POA: Diagnosis not present

## 2020-01-22 DIAGNOSIS — M5431 Sciatica, right side: Secondary | ICD-10-CM | POA: Diagnosis not present

## 2020-01-22 DIAGNOSIS — M9903 Segmental and somatic dysfunction of lumbar region: Secondary | ICD-10-CM | POA: Diagnosis not present

## 2020-01-27 ENCOUNTER — Ambulatory Visit: Payer: BC Managed Care – PPO | Admitting: Orthotics

## 2020-01-27 ENCOUNTER — Other Ambulatory Visit: Payer: Self-pay

## 2020-01-27 DIAGNOSIS — M778 Other enthesopathies, not elsewhere classified: Secondary | ICD-10-CM | POA: Diagnosis not present

## 2020-01-27 DIAGNOSIS — M722 Plantar fascial fibromatosis: Secondary | ICD-10-CM | POA: Diagnosis not present

## 2020-01-27 NOTE — Progress Notes (Signed)
Patient came into today to be cast for Custom Foot Orthotics. Upon recommendation of Dr. Al Corpus  Patient presents with hx of OA midfoot, plantar fas Goals are longtudnal arch support, RF stability, ff cushioning  Plan vendor [employer]

## 2020-01-28 DIAGNOSIS — M9905 Segmental and somatic dysfunction of pelvic region: Secondary | ICD-10-CM | POA: Diagnosis not present

## 2020-01-28 DIAGNOSIS — M9903 Segmental and somatic dysfunction of lumbar region: Secondary | ICD-10-CM | POA: Diagnosis not present

## 2020-01-28 DIAGNOSIS — M5431 Sciatica, right side: Secondary | ICD-10-CM | POA: Diagnosis not present

## 2020-01-28 DIAGNOSIS — M5386 Other specified dorsopathies, lumbar region: Secondary | ICD-10-CM | POA: Diagnosis not present

## 2020-01-29 DIAGNOSIS — M9903 Segmental and somatic dysfunction of lumbar region: Secondary | ICD-10-CM | POA: Diagnosis not present

## 2020-01-29 DIAGNOSIS — M9905 Segmental and somatic dysfunction of pelvic region: Secondary | ICD-10-CM | POA: Diagnosis not present

## 2020-01-29 DIAGNOSIS — M5431 Sciatica, right side: Secondary | ICD-10-CM | POA: Diagnosis not present

## 2020-01-29 DIAGNOSIS — M5386 Other specified dorsopathies, lumbar region: Secondary | ICD-10-CM | POA: Diagnosis not present

## 2020-02-03 DIAGNOSIS — M5386 Other specified dorsopathies, lumbar region: Secondary | ICD-10-CM | POA: Diagnosis not present

## 2020-02-03 DIAGNOSIS — M9905 Segmental and somatic dysfunction of pelvic region: Secondary | ICD-10-CM | POA: Diagnosis not present

## 2020-02-03 DIAGNOSIS — M9903 Segmental and somatic dysfunction of lumbar region: Secondary | ICD-10-CM | POA: Diagnosis not present

## 2020-02-03 DIAGNOSIS — M5431 Sciatica, right side: Secondary | ICD-10-CM | POA: Diagnosis not present

## 2020-02-04 ENCOUNTER — Telehealth: Payer: Self-pay | Admitting: Podiatry

## 2020-02-04 NOTE — Telephone Encounter (Signed)
Half size difference (smaller) will not make a bit of difference, in fact probably will fit better in her shoes.

## 2020-02-04 NOTE — Telephone Encounter (Signed)
Received both messages off my voicemail one from yesterday and one from today. Pt stated she told Raiford Noble her new balances were a size 7.5 and upon checking they are a size 8 and she was not sure if that would be a problem with the orthotics.  I checked and the inserts have already been made and shipped.  I tried to call pt again and her voicemail is full.

## 2020-02-04 NOTE — Telephone Encounter (Signed)
Pt called and spoke to Crystal upset about wrong shoe size.  I returned call and  Apologized but left message to give me a call @ [place] office until 330 today and if not after 4 in [place] at my direct number.

## 2020-02-05 DIAGNOSIS — M9903 Segmental and somatic dysfunction of lumbar region: Secondary | ICD-10-CM | POA: Diagnosis not present

## 2020-02-05 DIAGNOSIS — M5386 Other specified dorsopathies, lumbar region: Secondary | ICD-10-CM | POA: Diagnosis not present

## 2020-02-05 DIAGNOSIS — M9905 Segmental and somatic dysfunction of pelvic region: Secondary | ICD-10-CM | POA: Diagnosis not present

## 2020-02-05 DIAGNOSIS — M5431 Sciatica, right side: Secondary | ICD-10-CM | POA: Diagnosis not present

## 2020-02-05 NOTE — Telephone Encounter (Signed)
Pt left a message again yesterday afternoon.  I texted her as per her request with information from [place] stating the 1/2 size difference  would not be an issue and that the inserts have been made and shipped and I will call her to schedule an appt when they are checked in.

## 2020-02-10 DIAGNOSIS — M9905 Segmental and somatic dysfunction of pelvic region: Secondary | ICD-10-CM | POA: Diagnosis not present

## 2020-02-10 DIAGNOSIS — M5431 Sciatica, right side: Secondary | ICD-10-CM | POA: Diagnosis not present

## 2020-02-10 DIAGNOSIS — M9903 Segmental and somatic dysfunction of lumbar region: Secondary | ICD-10-CM | POA: Diagnosis not present

## 2020-02-10 DIAGNOSIS — M5386 Other specified dorsopathies, lumbar region: Secondary | ICD-10-CM | POA: Diagnosis not present

## 2020-02-12 DIAGNOSIS — M5431 Sciatica, right side: Secondary | ICD-10-CM | POA: Diagnosis not present

## 2020-02-12 DIAGNOSIS — M9903 Segmental and somatic dysfunction of lumbar region: Secondary | ICD-10-CM | POA: Diagnosis not present

## 2020-02-12 DIAGNOSIS — M9905 Segmental and somatic dysfunction of pelvic region: Secondary | ICD-10-CM | POA: Diagnosis not present

## 2020-02-12 DIAGNOSIS — M5386 Other specified dorsopathies, lumbar region: Secondary | ICD-10-CM | POA: Diagnosis not present

## 2020-02-17 DIAGNOSIS — M5386 Other specified dorsopathies, lumbar region: Secondary | ICD-10-CM | POA: Diagnosis not present

## 2020-02-17 DIAGNOSIS — M9903 Segmental and somatic dysfunction of lumbar region: Secondary | ICD-10-CM | POA: Diagnosis not present

## 2020-02-17 DIAGNOSIS — M9905 Segmental and somatic dysfunction of pelvic region: Secondary | ICD-10-CM | POA: Diagnosis not present

## 2020-02-17 DIAGNOSIS — M5431 Sciatica, right side: Secondary | ICD-10-CM | POA: Diagnosis not present

## 2020-02-18 ENCOUNTER — Other Ambulatory Visit: Payer: Self-pay

## 2020-02-18 ENCOUNTER — Ambulatory Visit: Payer: BC Managed Care – PPO | Admitting: Orthotics

## 2020-02-18 DIAGNOSIS — M722 Plantar fascial fibromatosis: Secondary | ICD-10-CM

## 2020-02-18 DIAGNOSIS — M778 Other enthesopathies, not elsewhere classified: Secondary | ICD-10-CM

## 2020-02-18 NOTE — Progress Notes (Signed)
Patient came in today to pick up custom made foot orthotics.  The goals were accomplished and the patient reported no dissatisfaction with said orthotics.  Patient was advised of breakin period and how to report any issues. 

## 2020-02-19 DIAGNOSIS — M9905 Segmental and somatic dysfunction of pelvic region: Secondary | ICD-10-CM | POA: Diagnosis not present

## 2020-02-19 DIAGNOSIS — M9903 Segmental and somatic dysfunction of lumbar region: Secondary | ICD-10-CM | POA: Diagnosis not present

## 2020-02-19 DIAGNOSIS — M5386 Other specified dorsopathies, lumbar region: Secondary | ICD-10-CM | POA: Diagnosis not present

## 2020-02-19 DIAGNOSIS — M5431 Sciatica, right side: Secondary | ICD-10-CM | POA: Diagnosis not present

## 2020-02-24 DIAGNOSIS — M5386 Other specified dorsopathies, lumbar region: Secondary | ICD-10-CM | POA: Diagnosis not present

## 2020-02-24 DIAGNOSIS — M5431 Sciatica, right side: Secondary | ICD-10-CM | POA: Diagnosis not present

## 2020-02-24 DIAGNOSIS — M9903 Segmental and somatic dysfunction of lumbar region: Secondary | ICD-10-CM | POA: Diagnosis not present

## 2020-02-24 DIAGNOSIS — M9905 Segmental and somatic dysfunction of pelvic region: Secondary | ICD-10-CM | POA: Diagnosis not present

## 2020-02-27 DIAGNOSIS — Z01419 Encounter for gynecological examination (general) (routine) without abnormal findings: Secondary | ICD-10-CM | POA: Diagnosis not present

## 2020-02-27 DIAGNOSIS — Z6827 Body mass index (BMI) 27.0-27.9, adult: Secondary | ICD-10-CM | POA: Diagnosis not present

## 2020-03-02 DIAGNOSIS — M5431 Sciatica, right side: Secondary | ICD-10-CM | POA: Diagnosis not present

## 2020-03-02 DIAGNOSIS — M9903 Segmental and somatic dysfunction of lumbar region: Secondary | ICD-10-CM | POA: Diagnosis not present

## 2020-03-02 DIAGNOSIS — M9905 Segmental and somatic dysfunction of pelvic region: Secondary | ICD-10-CM | POA: Diagnosis not present

## 2020-03-02 DIAGNOSIS — M5386 Other specified dorsopathies, lumbar region: Secondary | ICD-10-CM | POA: Diagnosis not present

## 2020-03-09 DIAGNOSIS — M9903 Segmental and somatic dysfunction of lumbar region: Secondary | ICD-10-CM | POA: Diagnosis not present

## 2020-03-09 DIAGNOSIS — M5386 Other specified dorsopathies, lumbar region: Secondary | ICD-10-CM | POA: Diagnosis not present

## 2020-03-09 DIAGNOSIS — M9905 Segmental and somatic dysfunction of pelvic region: Secondary | ICD-10-CM | POA: Diagnosis not present

## 2020-03-09 DIAGNOSIS — M5431 Sciatica, right side: Secondary | ICD-10-CM | POA: Diagnosis not present

## 2020-03-16 DIAGNOSIS — M5431 Sciatica, right side: Secondary | ICD-10-CM | POA: Diagnosis not present

## 2020-03-16 DIAGNOSIS — M5386 Other specified dorsopathies, lumbar region: Secondary | ICD-10-CM | POA: Diagnosis not present

## 2020-03-16 DIAGNOSIS — M9905 Segmental and somatic dysfunction of pelvic region: Secondary | ICD-10-CM | POA: Diagnosis not present

## 2020-03-16 DIAGNOSIS — M9903 Segmental and somatic dysfunction of lumbar region: Secondary | ICD-10-CM | POA: Diagnosis not present

## 2020-03-23 DIAGNOSIS — M5431 Sciatica, right side: Secondary | ICD-10-CM | POA: Diagnosis not present

## 2020-03-23 DIAGNOSIS — M5386 Other specified dorsopathies, lumbar region: Secondary | ICD-10-CM | POA: Diagnosis not present

## 2020-03-23 DIAGNOSIS — M9905 Segmental and somatic dysfunction of pelvic region: Secondary | ICD-10-CM | POA: Diagnosis not present

## 2020-03-23 DIAGNOSIS — M9903 Segmental and somatic dysfunction of lumbar region: Secondary | ICD-10-CM | POA: Diagnosis not present

## 2020-03-30 DIAGNOSIS — M5431 Sciatica, right side: Secondary | ICD-10-CM | POA: Diagnosis not present

## 2020-03-30 DIAGNOSIS — M5386 Other specified dorsopathies, lumbar region: Secondary | ICD-10-CM | POA: Diagnosis not present

## 2020-03-30 DIAGNOSIS — M9903 Segmental and somatic dysfunction of lumbar region: Secondary | ICD-10-CM | POA: Diagnosis not present

## 2020-03-30 DIAGNOSIS — M9905 Segmental and somatic dysfunction of pelvic region: Secondary | ICD-10-CM | POA: Diagnosis not present

## 2020-04-08 DIAGNOSIS — R300 Dysuria: Secondary | ICD-10-CM | POA: Diagnosis not present

## 2020-04-08 DIAGNOSIS — R319 Hematuria, unspecified: Secondary | ICD-10-CM | POA: Diagnosis not present

## 2020-04-10 DIAGNOSIS — R102 Pelvic and perineal pain: Secondary | ICD-10-CM | POA: Diagnosis not present

## 2020-04-10 DIAGNOSIS — R350 Frequency of micturition: Secondary | ICD-10-CM | POA: Diagnosis not present

## 2020-04-14 ENCOUNTER — Other Ambulatory Visit: Payer: Self-pay | Admitting: Physician Assistant

## 2020-04-14 DIAGNOSIS — R102 Pelvic and perineal pain: Secondary | ICD-10-CM

## 2020-04-15 ENCOUNTER — Ambulatory Visit
Admission: RE | Admit: 2020-04-15 | Discharge: 2020-04-15 | Disposition: A | Discharge status: Home / Self Care | Payer: BC Managed Care – PPO | Source: Ambulatory Visit | Attending: Physician Assistant | Admitting: Physician Assistant

## 2020-04-15 DIAGNOSIS — N858 Other specified noninflammatory disorders of uterus: Secondary | ICD-10-CM | POA: Diagnosis not present

## 2020-04-15 DIAGNOSIS — R102 Pelvic and perineal pain: Secondary | ICD-10-CM

## 2020-05-27 DIAGNOSIS — Z20822 Contact with and (suspected) exposure to covid-19: Secondary | ICD-10-CM | POA: Diagnosis not present

## 2020-05-28 ENCOUNTER — Ambulatory Visit
Admission: RE | Admit: 2020-05-28 | Discharge: 2020-05-28 | Disposition: A | Discharge status: Home / Self Care | Payer: BC Managed Care – PPO | Source: Ambulatory Visit | Attending: Physician Assistant | Admitting: Physician Assistant

## 2020-05-28 ENCOUNTER — Other Ambulatory Visit: Payer: Self-pay | Admitting: Physician Assistant

## 2020-05-28 DIAGNOSIS — M79604 Pain in right leg: Secondary | ICD-10-CM | POA: Diagnosis not present

## 2020-05-28 DIAGNOSIS — M79651 Pain in right thigh: Secondary | ICD-10-CM

## 2020-05-28 DIAGNOSIS — M79659 Pain in unspecified thigh: Secondary | ICD-10-CM | POA: Diagnosis not present

## 2020-06-01 DIAGNOSIS — D125 Benign neoplasm of sigmoid colon: Secondary | ICD-10-CM | POA: Diagnosis not present

## 2020-06-01 DIAGNOSIS — K5730 Diverticulosis of large intestine without perforation or abscess without bleeding: Secondary | ICD-10-CM | POA: Diagnosis not present

## 2020-06-01 DIAGNOSIS — Z1211 Encounter for screening for malignant neoplasm of colon: Secondary | ICD-10-CM | POA: Diagnosis not present

## 2020-06-03 DIAGNOSIS — R102 Pelvic and perineal pain: Secondary | ICD-10-CM | POA: Diagnosis not present

## 2020-06-03 DIAGNOSIS — R3121 Asymptomatic microscopic hematuria: Secondary | ICD-10-CM | POA: Diagnosis not present

## 2020-06-09 DIAGNOSIS — R3121 Asymptomatic microscopic hematuria: Secondary | ICD-10-CM | POA: Diagnosis not present

## 2020-06-15 DIAGNOSIS — M5136 Other intervertebral disc degeneration, lumbar region: Secondary | ICD-10-CM | POA: Diagnosis not present

## 2020-06-15 DIAGNOSIS — M5137 Other intervertebral disc degeneration, lumbosacral region: Secondary | ICD-10-CM | POA: Diagnosis not present

## 2020-06-15 DIAGNOSIS — I700 Atherosclerosis of aorta: Secondary | ICD-10-CM | POA: Diagnosis not present

## 2020-06-15 DIAGNOSIS — R3121 Asymptomatic microscopic hematuria: Secondary | ICD-10-CM | POA: Diagnosis not present

## 2020-06-15 DIAGNOSIS — D7389 Other diseases of spleen: Secondary | ICD-10-CM | POA: Diagnosis not present

## 2020-06-29 DIAGNOSIS — R102 Pelvic and perineal pain: Secondary | ICD-10-CM | POA: Diagnosis not present

## 2020-06-29 DIAGNOSIS — R3121 Asymptomatic microscopic hematuria: Secondary | ICD-10-CM | POA: Diagnosis not present

## 2020-07-07 DIAGNOSIS — E7800 Pure hypercholesterolemia, unspecified: Secondary | ICD-10-CM | POA: Diagnosis not present

## 2020-07-07 DIAGNOSIS — K219 Gastro-esophageal reflux disease without esophagitis: Secondary | ICD-10-CM | POA: Diagnosis not present

## 2020-07-07 DIAGNOSIS — F419 Anxiety disorder, unspecified: Secondary | ICD-10-CM | POA: Diagnosis not present

## 2020-07-07 DIAGNOSIS — J309 Allergic rhinitis, unspecified: Secondary | ICD-10-CM | POA: Diagnosis not present

## 2020-07-07 DIAGNOSIS — Z23 Encounter for immunization: Secondary | ICD-10-CM | POA: Diagnosis not present

## 2020-07-07 DIAGNOSIS — Z0000 Encounter for general adult medical examination without abnormal findings: Secondary | ICD-10-CM | POA: Diagnosis not present

## 2020-07-10 ENCOUNTER — Other Ambulatory Visit: Payer: Self-pay | Admitting: Physician Assistant

## 2020-07-10 DIAGNOSIS — M79606 Pain in leg, unspecified: Secondary | ICD-10-CM

## 2020-07-16 DIAGNOSIS — L819 Disorder of pigmentation, unspecified: Secondary | ICD-10-CM | POA: Diagnosis not present

## 2020-07-16 DIAGNOSIS — L814 Other melanin hyperpigmentation: Secondary | ICD-10-CM | POA: Diagnosis not present

## 2020-07-16 DIAGNOSIS — D3617 Benign neoplasm of peripheral nerves and autonomic nervous system of trunk, unspecified: Secondary | ICD-10-CM | POA: Diagnosis not present

## 2020-07-16 DIAGNOSIS — L820 Inflamed seborrheic keratosis: Secondary | ICD-10-CM | POA: Diagnosis not present

## 2020-07-16 DIAGNOSIS — D225 Melanocytic nevi of trunk: Secondary | ICD-10-CM | POA: Diagnosis not present

## 2020-08-03 ENCOUNTER — Ambulatory Visit
Admission: RE | Admit: 2020-08-03 | Discharge: 2020-08-03 | Disposition: A | Discharge status: Home / Self Care | Payer: BC Managed Care – PPO | Source: Ambulatory Visit | Attending: Physician Assistant | Admitting: Physician Assistant

## 2020-08-03 ENCOUNTER — Other Ambulatory Visit: Payer: Self-pay | Admitting: Physician Assistant

## 2020-08-03 ENCOUNTER — Other Ambulatory Visit: Payer: Self-pay

## 2020-08-03 DIAGNOSIS — M79606 Pain in leg, unspecified: Secondary | ICD-10-CM

## 2020-08-22 ENCOUNTER — Ambulatory Visit
Admission: RE | Admit: 2020-08-22 | Discharge: 2020-08-22 | Disposition: A | Discharge status: Home / Self Care | Payer: BC Managed Care – PPO | Source: Ambulatory Visit | Attending: Physician Assistant | Admitting: Physician Assistant

## 2020-08-22 ENCOUNTER — Other Ambulatory Visit: Payer: Self-pay

## 2020-08-22 DIAGNOSIS — M79606 Pain in leg, unspecified: Secondary | ICD-10-CM

## 2020-08-22 DIAGNOSIS — R200 Anesthesia of skin: Secondary | ICD-10-CM | POA: Diagnosis not present

## 2020-08-22 DIAGNOSIS — M25551 Pain in right hip: Secondary | ICD-10-CM | POA: Diagnosis not present

## 2020-08-22 DIAGNOSIS — M79651 Pain in right thigh: Secondary | ICD-10-CM | POA: Diagnosis not present

## 2020-08-24 DIAGNOSIS — Z1231 Encounter for screening mammogram for malignant neoplasm of breast: Secondary | ICD-10-CM | POA: Diagnosis not present

## 2020-09-11 DIAGNOSIS — M7061 Trochanteric bursitis, right hip: Secondary | ICD-10-CM | POA: Diagnosis not present

## 2020-12-16 DIAGNOSIS — J3489 Other specified disorders of nose and nasal sinuses: Secondary | ICD-10-CM | POA: Diagnosis not present

## 2021-01-07 DIAGNOSIS — E7800 Pure hypercholesterolemia, unspecified: Secondary | ICD-10-CM | POA: Diagnosis not present

## 2021-01-07 DIAGNOSIS — J309 Allergic rhinitis, unspecified: Secondary | ICD-10-CM | POA: Diagnosis not present

## 2021-01-07 DIAGNOSIS — F419 Anxiety disorder, unspecified: Secondary | ICD-10-CM | POA: Diagnosis not present

## 2021-01-07 DIAGNOSIS — Z23 Encounter for immunization: Secondary | ICD-10-CM | POA: Diagnosis not present

## 2021-01-07 DIAGNOSIS — Z5181 Encounter for therapeutic drug level monitoring: Secondary | ICD-10-CM | POA: Diagnosis not present

## 2021-01-07 DIAGNOSIS — K219 Gastro-esophageal reflux disease without esophagitis: Secondary | ICD-10-CM | POA: Diagnosis not present

## 2021-01-15 DIAGNOSIS — L570 Actinic keratosis: Secondary | ICD-10-CM | POA: Diagnosis not present

## 2021-02-12 DIAGNOSIS — H5010 Unspecified exotropia: Secondary | ICD-10-CM | POA: Diagnosis not present

## 2021-02-12 DIAGNOSIS — H2513 Age-related nuclear cataract, bilateral: Secondary | ICD-10-CM | POA: Diagnosis not present

## 2021-02-12 DIAGNOSIS — H524 Presbyopia: Secondary | ICD-10-CM | POA: Diagnosis not present

## 2021-02-12 DIAGNOSIS — H532 Diplopia: Secondary | ICD-10-CM | POA: Diagnosis not present

## 2021-03-31 DIAGNOSIS — Z6827 Body mass index (BMI) 27.0-27.9, adult: Secondary | ICD-10-CM | POA: Diagnosis not present

## 2021-03-31 DIAGNOSIS — Z113 Encounter for screening for infections with a predominantly sexual mode of transmission: Secondary | ICD-10-CM | POA: Diagnosis not present

## 2021-03-31 DIAGNOSIS — Z01419 Encounter for gynecological examination (general) (routine) without abnormal findings: Secondary | ICD-10-CM | POA: Diagnosis not present

## 2021-03-31 DIAGNOSIS — Z124 Encounter for screening for malignant neoplasm of cervix: Secondary | ICD-10-CM | POA: Diagnosis not present

## 2021-03-31 DIAGNOSIS — R319 Hematuria, unspecified: Secondary | ICD-10-CM | POA: Diagnosis not present

## 2021-06-03 ENCOUNTER — Other Ambulatory Visit: Payer: Self-pay

## 2021-06-03 ENCOUNTER — Ambulatory Visit (INDEPENDENT_AMBULATORY_CARE_PROVIDER_SITE_OTHER): Payer: BC Managed Care – PPO | Admitting: Podiatrist

## 2021-06-03 ENCOUNTER — Encounter: Payer: Self-pay | Admitting: Podiatrist

## 2021-06-03 ENCOUNTER — Other Ambulatory Visit: Payer: Self-pay | Admitting: Podiatrist

## 2021-06-03 ENCOUNTER — Ambulatory Visit (INDEPENDENT_AMBULATORY_CARE_PROVIDER_SITE_OTHER): Payer: BC Managed Care – PPO

## 2021-06-03 DIAGNOSIS — M779 Enthesopathy, unspecified: Secondary | ICD-10-CM

## 2021-06-03 DIAGNOSIS — M25571 Pain in right ankle and joints of right foot: Secondary | ICD-10-CM

## 2021-06-03 DIAGNOSIS — M25572 Pain in left ankle and joints of left foot: Secondary | ICD-10-CM | POA: Diagnosis not present

## 2021-06-03 DIAGNOSIS — M79672 Pain in left foot: Secondary | ICD-10-CM

## 2021-06-03 DIAGNOSIS — M79671 Pain in right foot: Secondary | ICD-10-CM

## 2021-06-03 DIAGNOSIS — M21612 Bunion of left foot: Secondary | ICD-10-CM | POA: Diagnosis not present

## 2021-06-03 MED ORDER — GABAPENTIN 100 MG PO CAPS: 100.0000 mg | ORAL_CAPSULE | Freq: Every day | ORAL | 2 refills | Status: DC

## 2021-06-03 NOTE — Progress Notes (Signed)
Chief Complaint  Patient presents with   Pain    BL dorsal, ankles and bottom heel pain x years- 7/10 sharp dull pain -no injury -worse with walking tx: icing, IBU, stretching and topical cream      HPI: Patient is 65 y.o. female who presents today for t generalized pain bilateral feet and ankles all for years.  She states that at about 3 in the morning she is woken up with a generalized foot pain of which she treats with a Advil and topical pain cream.  She states that this makes it feel better and she is able to return to sleep.  She walks 3 miles every day and states that after walking she has to rest significantly to be be able to do more activities on her feet.  She was diagnosed with capsulitis of her right foot and she has orthotics which she states she wears but states she still has fatigue and pain after her walks.    She would like recommendations on how to make her feet feel better.  Patient Active Problem List   Diagnosis Date Noted   Pain in joint of left shoulder 12/27/2017    Current Outpatient Medications on File Prior to Visit  Medication Sig Dispense Refill   atorvastatin (LIPITOR) 10 MG tablet Take 10 mg by mouth daily.     cetirizine (ZYRTEC) 10 MG tablet Take 1 tablet (10 mg total) by mouth daily. 30 tablet 0   diclofenac (VOLTAREN) 50 MG EC tablet diclofenac sodium 50 mg tablet,delayed release  TAKE 1 TABLET BY MOUTH TWICE DAILY WITH FOOD     dicyclomine (BENTYL) 20 MG tablet Take 20 mg by mouth 3 (three) times daily as needed for muscle spasms.     FYAVOLV 0.5-2.5 MG-MCG tablet      LORazepam (ATIVAN) 0.5 MG tablet Take 0.5 mg by mouth 2 (two) times daily as needed for anxiety.      meloxicam (MOBIC) 15 MG tablet Take 1 tablet (15 mg total) by mouth daily. (Patient not taking: Reported on 08/19/2019) 30 tablet 3   omeprazole (PRILOSEC) 40 MG capsule omeprazole 40 mg capsule,delayed release     pantoprazole (PROTONIX) 40 MG tablet TK 1 T PO QD PRN     polyethylene  glycol (MIRALAX / GLYCOLAX) packet Take 17 g by mouth every other day.      sertraline (ZOLOFT) 25 MG tablet Take 1 tablet (25 mg total) by mouth daily. Increase to 2 tablets and 7 days. (Patient taking differently: Take 25 mg by mouth daily. ) 60 tablet 1   triamcinolone cream (KENALOG) 0.1 % triamcinolone acetonide 0.1 % topical cream  APPLY AA ON THE SKIN 2-3 XD FOR 2 WEEKS. MIX WITH CERAVE CREAM UTD     valACYclovir (VALTREX) 1000 MG tablet valacyclovir 1 gram tablet     No current facility-administered medications on file prior to visit.    Allergies  Allergen Reactions   Codeine Nausea Only   Penicillins Rash    Review of Systems No fevers, chills, nausea, muscle aches, no difficulty breathing, no calf pain, no chest pain or shortness of breath.   Physical Exam  GENERAL APPEARANCE: Alert, conversant. Appropriately groomed. No acute distress.   VASCULAR: Pedal pulses palpable DP and PT bilateral.  Capillary refill time is immediate to all digits,  Proximal to distal cooling it warm to warm.  Digital perfusion adequate.   NEUROLOGIC: sensation is intact to 5.07 monofilament at 5/5 sites bilateral.  Light touch  is intact bilateral, vibratory sensation intact bilateral  MUSCULOSKELETAL: acceptable muscle strength, tone and stability bilateral.  Mild to moderate bunion deformity is present left.  Tailor's bunion deformity is also present left.  Range of motion adequate all joints of the foot and ankle. Pain on palpation is noted generally at the dorsal midfoot and heels bilateral.  No area of pinpoint discomfort is identified on exam.  DERMATOLOGIC: skin is warm, supple, and dry.  No open lesions noted.  No rash, no pre ulcerative lesions. Digital nails are asymptomatic.    X-ray evaluation 3 views of bilateral feet are obtained.  A noticeable bunion deformity and tailor's bunion deformity are present on the left foot a plantar heel spur is present bilateral first metatarsal  elevation is noted bilateral as well.  Small amount of arthritic changes are noted at the Talar navicular joint on the left.  Otherwise joint spaces are normal.  No acute osseous abnormalities are seen.   Assessment     ICD-10-CM   1. Bilateral foot pain  M79.671 CANCELED: DG Foot Complete Right   M79.672 CANCELED: DG Foot Complete Left    2. Bunion of great toe of left foot  M21.612     3. Pain in joint of right foot  M25.571     4. Tendonitis  M77.9         Plan  Exam and x-ray findings are discussed with the patient.  Because of the generalized nature of the foot pain I recommended continued use of her orthotics and her good running shoes and sneakers.  I also recommended that she always wear shoe at home and not to go barefoot.  We discussed different options including crocs and of slippers.  For the nighttime pain I discussed a trial of gabapentin and she would like to try this we will put her on gabapentin 100 mg nightly and see how she does.  I discussed if it is helpful but not fully beneficial I can increase her dose to 300 mg nightly as needed. I am also writing her for a neuropathic pain cream from [place] I will order this and have them contact her with their information. She will be seen back in the future as needed if any concerns arise she will call.

## 2021-06-03 NOTE — Patient Instructions (Signed)
I have ordered a medication for you that will come from [facility] in [place]. They should be calling you to verify insurance and will mail the medication to you. If you live close by then you can go by their pharmacy to pick up the medication. Their phone number is [phone]. If you do not hear from them in the next few days, please give us a call at [phone].   

## 2021-06-17 ENCOUNTER — Telehealth: Payer: Self-pay | Admitting: *Deleted

## 2021-06-17 NOTE — Telephone Encounter (Signed)
I think she needs an appointment and we can evaluate and discuss other treatment options

## 2021-06-17 NOTE — Telephone Encounter (Signed)
Noted  

## 2021-06-17 NOTE — Telephone Encounter (Addendum)
Dr Faylene Million patient is calling because the pain in her leg and foot is not better but worse.The medication prescribed not working,what is the next step? Please advise.

## 2021-06-17 NOTE — Telephone Encounter (Signed)
Patient scheduled for tomorrow @ 9:45. She wanted to let the doctor know that she is reluctant about coming, has seen 2 other doctors and still having pain but willing to try another.

## 2021-06-18 ENCOUNTER — Ambulatory Visit: Payer: BC Managed Care – PPO | Admitting: Podiatry

## 2021-06-18 ENCOUNTER — Other Ambulatory Visit: Payer: Self-pay

## 2021-06-18 DIAGNOSIS — G9519 Other vascular myelopathies: Secondary | ICD-10-CM | POA: Diagnosis not present

## 2021-06-18 NOTE — Progress Notes (Signed)
  Subjective:  Patient ID: Angelica Hartman, female    DOB: 07-25-1956,  MRN: [id number]  Chief Complaint  Patient presents with   Leg Problem    Bilateral tingling/numbness from waist down.    65 y.o. female presents with the above complaint. History confirmed with patient. Reports severe tingling in both legs and feet from the waist down. States this prevents her from sleeping at night. Is an avid walker, walking 3 miles per day. States pain/tingling rated at 7/10. Is getting worse, was given gabapentin previously and states it did not help.  Objective:  Physical Exam: warm, good capillary refill, no trophic changes or ulcerative lesions, normal DP pulses, hard to palpate PT pulses, temperature decreased bilateral legs; Normal sensory exam: sensation intact to light touch all nerve distributions. Negative single leg raise test bilaterally. Protective sensation intact. Left Foot: midfoot dorsal spur, no discrete POP Right Foot: midfoot dorsal spur, no discrete POP Assessment:   1. Neurogenic claudication (HCC)    Plan:  Patient was evaluated and treated and all questions answered.  ?Claudication, possibly neurogenic -Refer to neurology for evaluation. -Discussed with patient that she would benefit from taking gabapentin more consistently for at least a week to see if this would help, patient declined.  Return if symptoms worsen or fail to improve.

## 2021-08-18 DIAGNOSIS — L237 Allergic contact dermatitis due to plants, except food: Secondary | ICD-10-CM | POA: Diagnosis not present

## 2021-08-25 DIAGNOSIS — Z1231 Encounter for screening mammogram for malignant neoplasm of breast: Secondary | ICD-10-CM | POA: Diagnosis not present

## 2021-09-03 DIAGNOSIS — H6121 Impacted cerumen, right ear: Secondary | ICD-10-CM | POA: Diagnosis not present

## 2021-09-03 DIAGNOSIS — H6591 Unspecified nonsuppurative otitis media, right ear: Secondary | ICD-10-CM | POA: Diagnosis not present

## 2021-09-14 ENCOUNTER — Ambulatory Visit: Payer: BC Managed Care – PPO | Admitting: Diagnostic Neuroimaging

## 2021-09-15 DIAGNOSIS — J309 Allergic rhinitis, unspecified: Secondary | ICD-10-CM | POA: Diagnosis not present

## 2021-09-15 DIAGNOSIS — E7800 Pure hypercholesterolemia, unspecified: Secondary | ICD-10-CM | POA: Diagnosis not present

## 2021-09-15 DIAGNOSIS — K219 Gastro-esophageal reflux disease without esophagitis: Secondary | ICD-10-CM | POA: Diagnosis not present

## 2021-09-15 DIAGNOSIS — Z23 Encounter for immunization: Secondary | ICD-10-CM | POA: Diagnosis not present

## 2021-09-15 DIAGNOSIS — F419 Anxiety disorder, unspecified: Secondary | ICD-10-CM | POA: Diagnosis not present

## 2021-09-16 ENCOUNTER — Ambulatory Visit: Payer: BC Managed Care – PPO | Admitting: Neurology

## 2021-09-20 IMAGING — DX DG FEMUR 2+V*R*
3 series · 3 of 3 positions shown · non-contrast
Comparison: None.

CLINICAL DATA: Right leg pain for several months, no known injury,
initial encounter

EXAM:
RIGHT FEMUR 2 VIEWS

[dg femur, min 2 views right (1 of 3)]
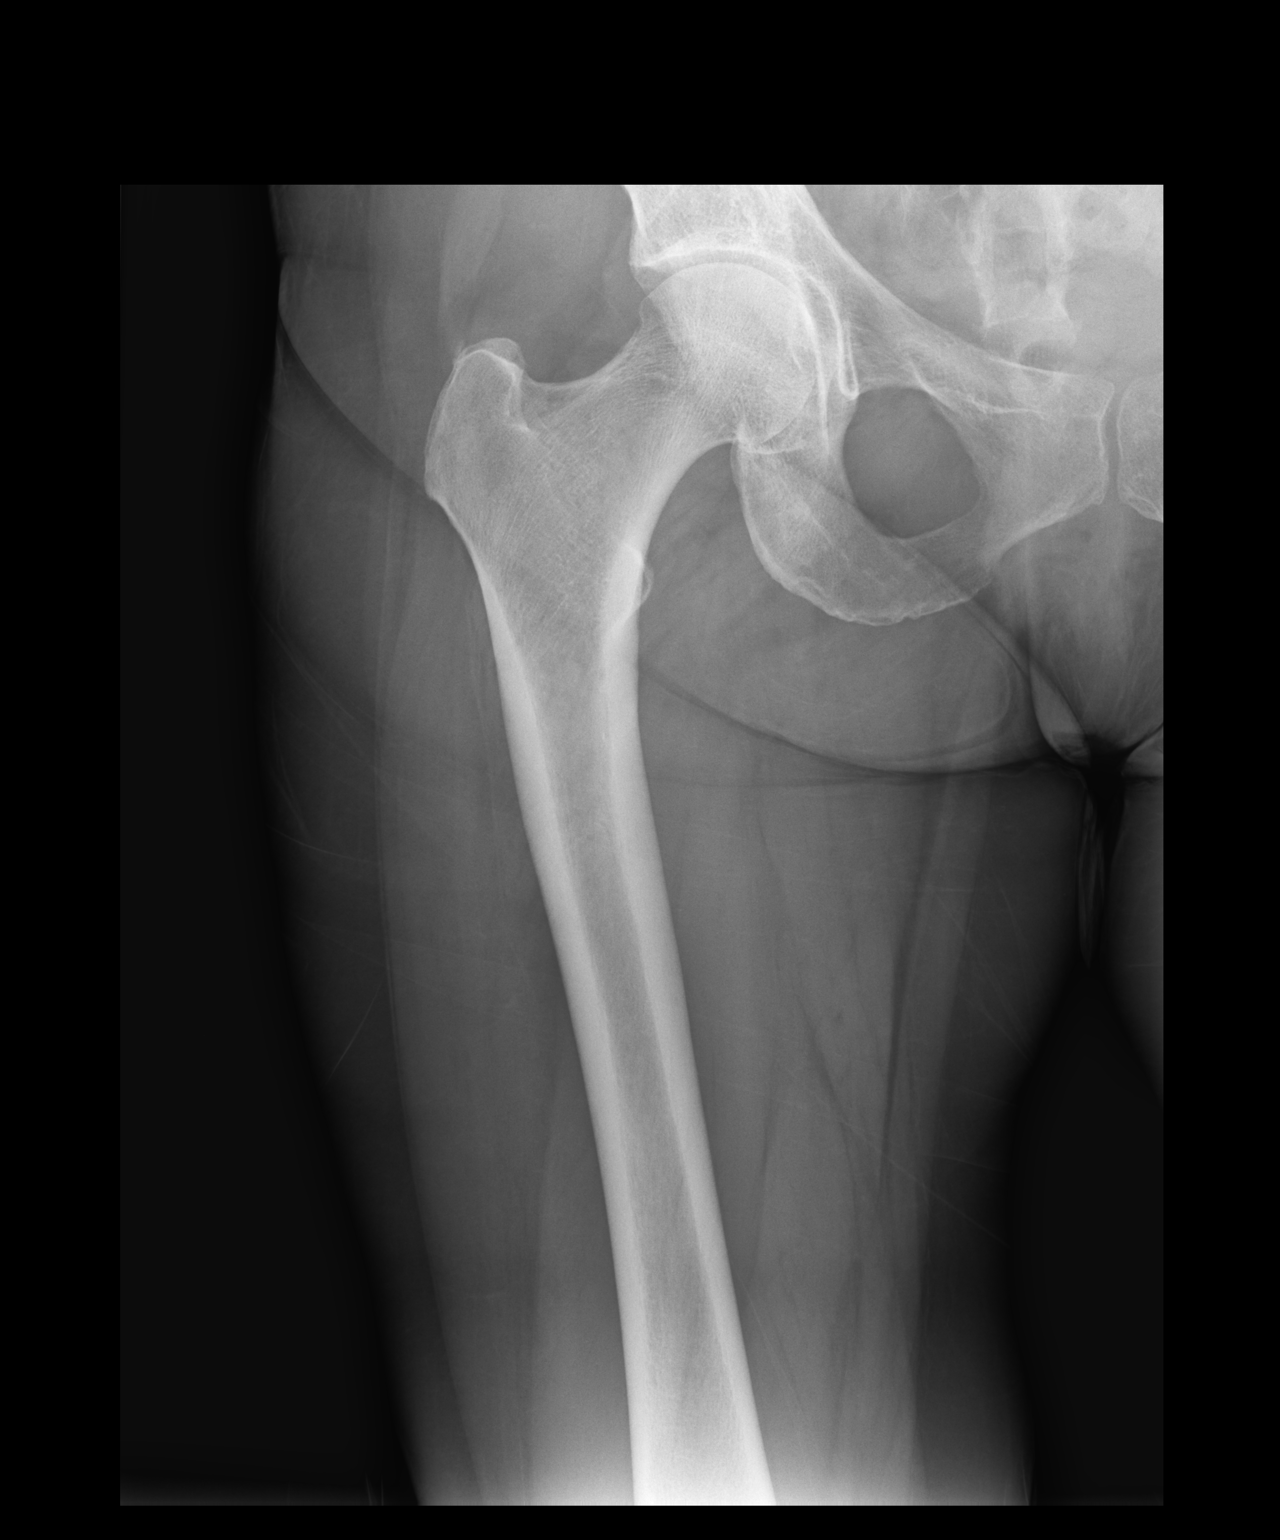

[dg femur, min 2 views right (2 of 3)]
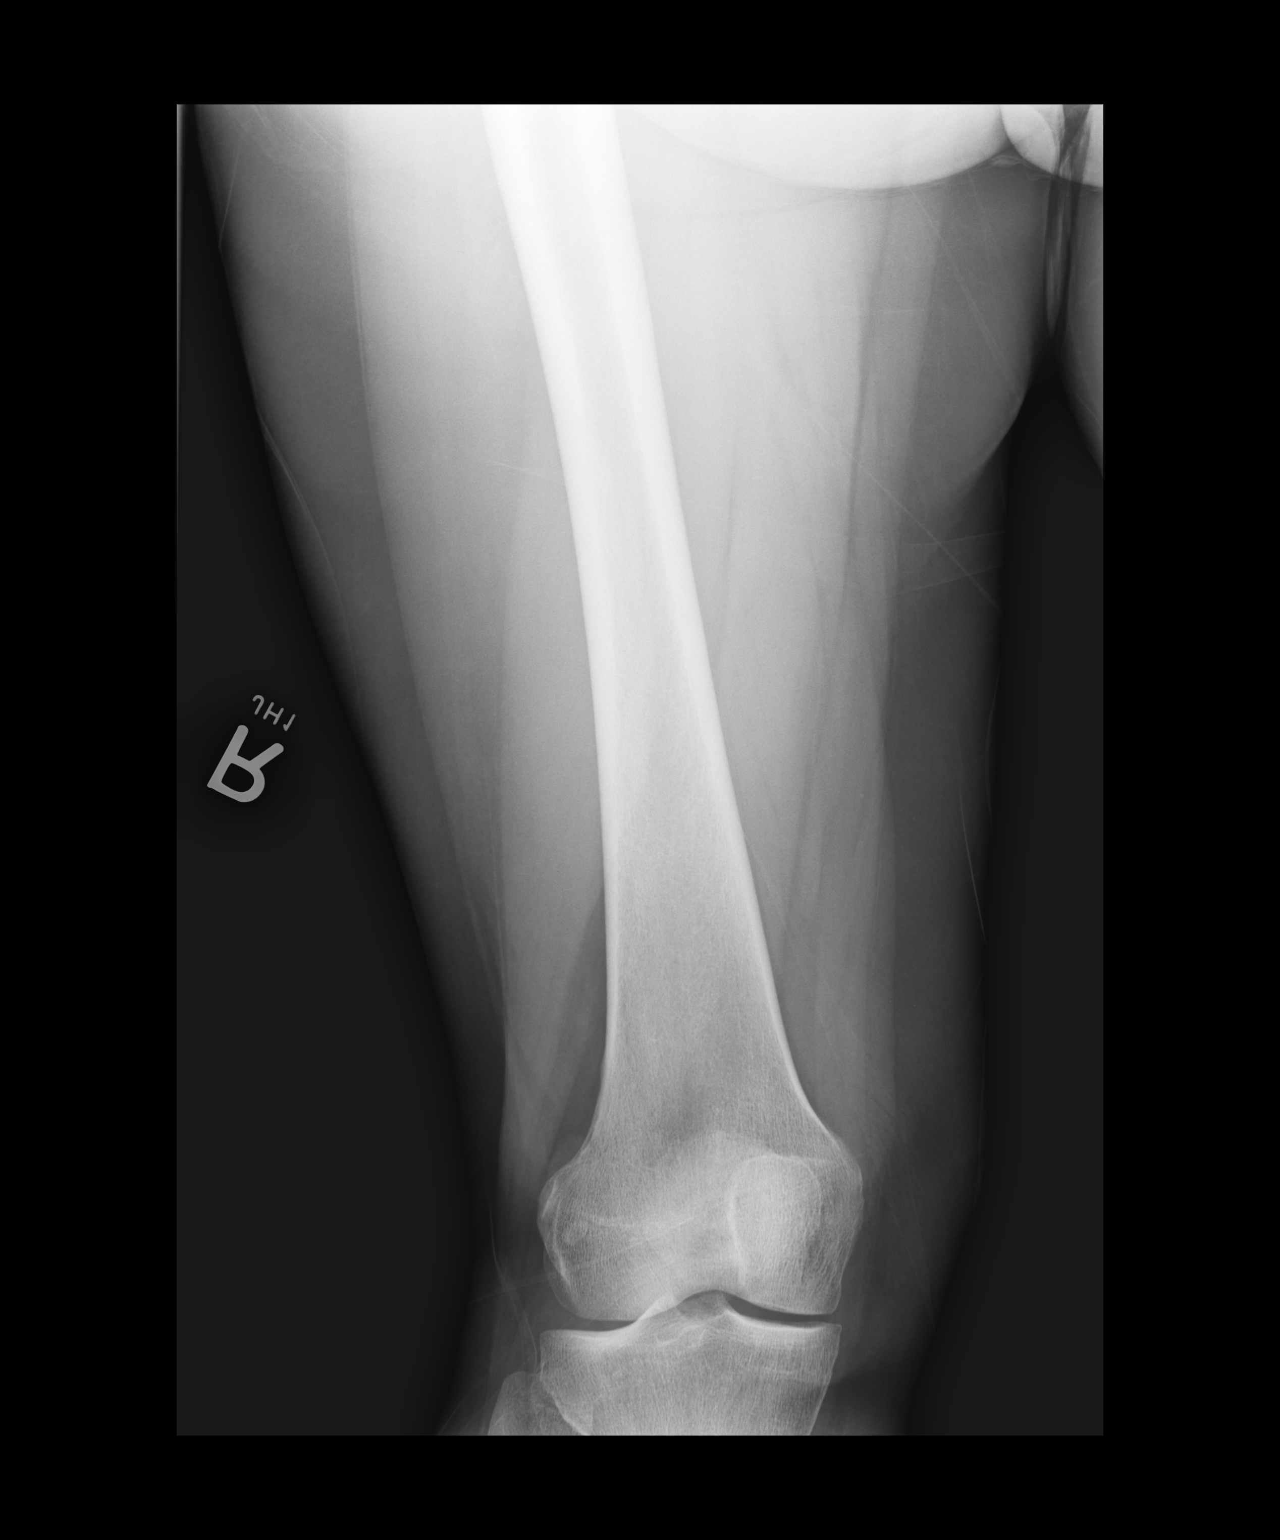

[dg femur, min 2 views right (3 of 3)]
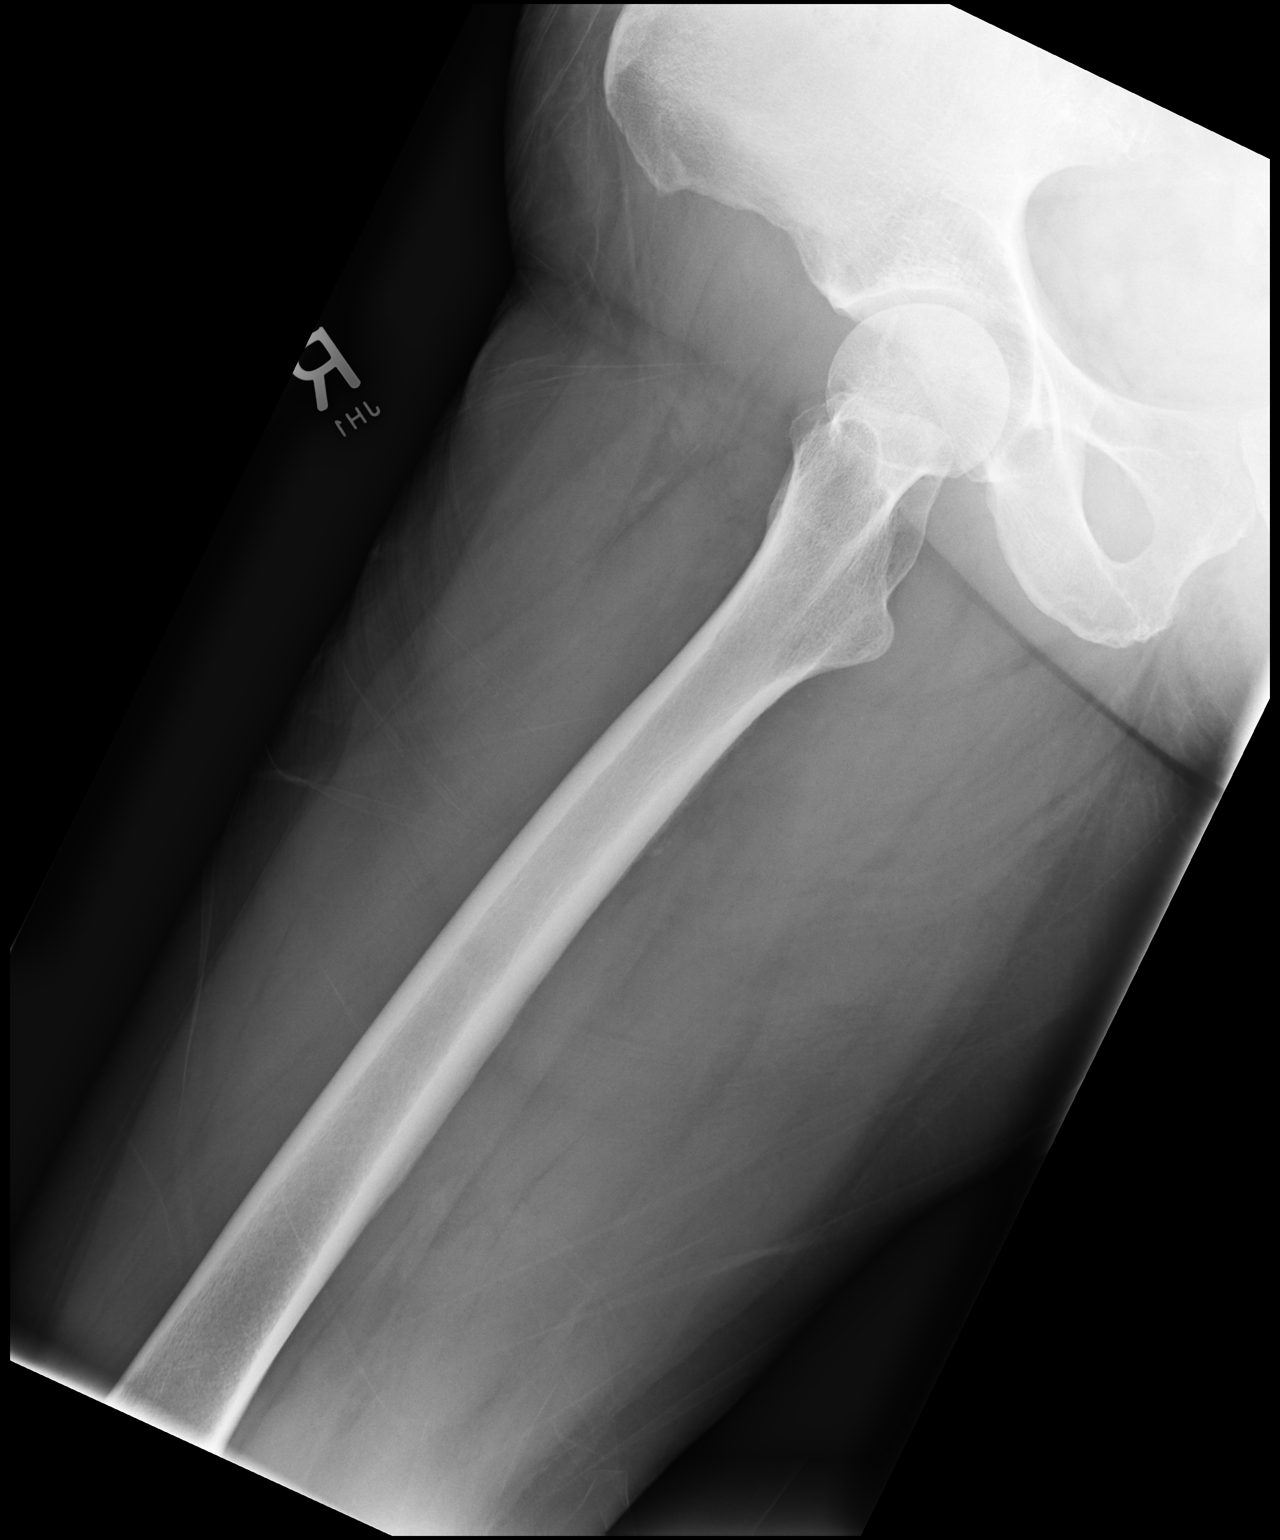

[3 of 3 positions shown; findings below may reference images not displayed]

FINDINGS: There is no evidence of fracture or other focal bone lesions. Soft
tissues are unremarkable.
IMPRESSION: No acute abnormality noted.

## 2021-10-22 DIAGNOSIS — H903 Sensorineural hearing loss, bilateral: Secondary | ICD-10-CM | POA: Diagnosis not present

## 2021-10-22 DIAGNOSIS — J342 Deviated nasal septum: Secondary | ICD-10-CM | POA: Diagnosis not present

## 2021-11-04 DIAGNOSIS — L819 Disorder of pigmentation, unspecified: Secondary | ICD-10-CM | POA: Diagnosis not present

## 2021-11-04 DIAGNOSIS — L570 Actinic keratosis: Secondary | ICD-10-CM | POA: Diagnosis not present

## 2021-11-04 DIAGNOSIS — D225 Melanocytic nevi of trunk: Secondary | ICD-10-CM | POA: Diagnosis not present

## 2021-11-04 DIAGNOSIS — L603 Nail dystrophy: Secondary | ICD-10-CM | POA: Diagnosis not present

## 2022-10-13 ENCOUNTER — Other Ambulatory Visit: Payer: Self-pay | Admitting: Family Medicine

## 2022-10-13 DIAGNOSIS — R198 Other specified symptoms and signs involving the digestive system and abdomen: Secondary | ICD-10-CM

## 2022-10-13 DIAGNOSIS — R109 Unspecified abdominal pain: Secondary | ICD-10-CM

## 2022-10-17 ENCOUNTER — Ambulatory Visit
Admission: RE | Admit: 2022-10-17 | Discharge: 2022-10-17 | Disposition: A | Discharge status: Home / Self Care | Payer: No Typology Code available for payment source | Source: Ambulatory Visit | Attending: Family Medicine | Admitting: Family Medicine

## 2022-10-17 DIAGNOSIS — R198 Other specified symptoms and signs involving the digestive system and abdomen: Secondary | ICD-10-CM

## 2022-10-17 DIAGNOSIS — R109 Unspecified abdominal pain: Secondary | ICD-10-CM

## 2022-10-18 ENCOUNTER — Ambulatory Visit
Admission: RE | Admit: 2022-10-18 | Discharge: 2022-10-18 | Disposition: A | Discharge status: Home / Self Care | Payer: No Typology Code available for payment source | Source: Ambulatory Visit | Attending: Physician Assistant | Admitting: Physician Assistant

## 2022-10-18 ENCOUNTER — Other Ambulatory Visit: Payer: Self-pay | Admitting: Physician Assistant

## 2022-10-18 DIAGNOSIS — R109 Unspecified abdominal pain: Secondary | ICD-10-CM

## 2022-10-18 DIAGNOSIS — R935 Abnormal findings on diagnostic imaging of other abdominal regions, including retroperitoneum: Secondary | ICD-10-CM

## 2022-10-18 MED ORDER — IOPAMIDOL (ISOVUE-300) INJECTION 61%
100.0000 mL | Freq: Once | INTRAVENOUS | Status: AC | PRN
  Administered 2022-10-18: 100 mL via INTRAVENOUS

## 2023-06-05 DIAGNOSIS — R69 Illness, unspecified: Secondary | ICD-10-CM | POA: Diagnosis not present

## 2023-07-03 DIAGNOSIS — L738 Other specified follicular disorders: Secondary | ICD-10-CM | POA: Diagnosis not present

## 2023-07-03 DIAGNOSIS — D2372 Other benign neoplasm of skin of left lower limb, including hip: Secondary | ICD-10-CM | POA: Diagnosis not present

## 2023-07-03 DIAGNOSIS — L570 Actinic keratosis: Secondary | ICD-10-CM | POA: Diagnosis not present

## 2023-07-03 DIAGNOSIS — D225 Melanocytic nevi of trunk: Secondary | ICD-10-CM | POA: Diagnosis not present

## 2023-07-03 DIAGNOSIS — L821 Other seborrheic keratosis: Secondary | ICD-10-CM | POA: Diagnosis not present

## 2023-07-03 DIAGNOSIS — R208 Other disturbances of skin sensation: Secondary | ICD-10-CM | POA: Diagnosis not present

## 2023-07-03 DIAGNOSIS — L812 Freckles: Secondary | ICD-10-CM | POA: Diagnosis not present

## 2023-07-06 DIAGNOSIS — R69 Illness, unspecified: Secondary | ICD-10-CM | POA: Diagnosis not present

## 2023-07-12 DIAGNOSIS — M6530 Trigger finger, unspecified finger: Secondary | ICD-10-CM | POA: Diagnosis not present

## 2023-07-12 DIAGNOSIS — K219 Gastro-esophageal reflux disease without esophagitis: Secondary | ICD-10-CM | POA: Diagnosis not present

## 2023-07-12 DIAGNOSIS — I700 Atherosclerosis of aorta: Secondary | ICD-10-CM | POA: Diagnosis not present

## 2023-07-12 DIAGNOSIS — Z23 Encounter for immunization: Secondary | ICD-10-CM | POA: Diagnosis not present

## 2023-07-12 DIAGNOSIS — Z0000 Encounter for general adult medical examination without abnormal findings: Secondary | ICD-10-CM | POA: Diagnosis not present

## 2023-07-12 DIAGNOSIS — Z1159 Encounter for screening for other viral diseases: Secondary | ICD-10-CM | POA: Diagnosis not present

## 2023-07-12 DIAGNOSIS — J309 Allergic rhinitis, unspecified: Secondary | ICD-10-CM | POA: Diagnosis not present

## 2023-07-12 DIAGNOSIS — F419 Anxiety disorder, unspecified: Secondary | ICD-10-CM | POA: Diagnosis not present

## 2023-07-12 DIAGNOSIS — E7800 Pure hypercholesterolemia, unspecified: Secondary | ICD-10-CM | POA: Diagnosis not present

## 2023-07-12 DIAGNOSIS — K760 Fatty (change of) liver, not elsewhere classified: Secondary | ICD-10-CM | POA: Diagnosis not present

## 2023-07-21 DIAGNOSIS — N63 Unspecified lump in unspecified breast: Secondary | ICD-10-CM | POA: Diagnosis not present

## 2023-07-21 DIAGNOSIS — N6315 Unspecified lump in the right breast, overlapping quadrants: Secondary | ICD-10-CM | POA: Diagnosis not present

## 2023-07-25 ENCOUNTER — Other Ambulatory Visit: Payer: Self-pay | Admitting: Radiology

## 2023-07-25 DIAGNOSIS — R922 Inconclusive mammogram: Secondary | ICD-10-CM | POA: Diagnosis not present

## 2023-07-25 DIAGNOSIS — D0591 Unspecified type of carcinoma in situ of right breast: Secondary | ICD-10-CM | POA: Diagnosis not present

## 2023-07-27 ENCOUNTER — Telehealth: Payer: Self-pay | Admitting: Hematology and Oncology

## 2023-07-27 NOTE — Telephone Encounter (Signed)
Spoke to patient to confirm upcoming afternoon [facility] clinic appointment on 8/28, paperwork will be sent via [employer].   Gave location and time, also informed patient that the surgeon's office would be calling as well to get information from them similar to the packet that they will be receiving so make sure to do both.  Reminded patient that all providers will be coming to the clinic to see them HERE and if they had any questions to not hesitate to reach back out to myself or their navigators.

## 2023-07-31 ENCOUNTER — Encounter: Payer: Self-pay | Admitting: *Deleted

## 2023-07-31 DIAGNOSIS — Z170 Estrogen receptor positive status [ER+]: Secondary | ICD-10-CM

## 2023-07-31 DIAGNOSIS — C50511 Malignant neoplasm of lower-outer quadrant of right female breast: Secondary | ICD-10-CM | POA: Insufficient documentation

## 2023-08-02 ENCOUNTER — Ambulatory Visit
Admission: RE | Admit: 2023-08-02 | Discharge: 2023-08-02 | Disposition: A | Discharge status: Home / Self Care | Payer: Medicare HMO | Source: Ambulatory Visit | Attending: Radiation Oncology | Admitting: Radiation Oncology

## 2023-08-02 ENCOUNTER — Encounter: Payer: Self-pay | Admitting: General Practice

## 2023-08-02 ENCOUNTER — Other Ambulatory Visit: Payer: Self-pay | Admitting: General Surgery

## 2023-08-02 ENCOUNTER — Encounter: Payer: Self-pay | Admitting: Hematology and Oncology

## 2023-08-02 ENCOUNTER — Ambulatory Visit: Payer: Medicare HMO | Admitting: Physical Therapy

## 2023-08-02 ENCOUNTER — Telehealth: Payer: Self-pay | Admitting: Genetic Counselor

## 2023-08-02 ENCOUNTER — Inpatient Hospital Stay (HOSPITAL_BASED_OUTPATIENT_CLINIC_OR_DEPARTMENT_OTHER): Payer: Medicare HMO | Admitting: Hematology and Oncology

## 2023-08-02 ENCOUNTER — Inpatient Hospital Stay: Payer: Medicare HMO | Attending: Hematology and Oncology

## 2023-08-02 VITALS — BP 116/70 | HR 78 | Temp 97.7°F | Resp 18 | Ht 61.0 in | Wt 150.3 lb

## 2023-08-02 DIAGNOSIS — Z170 Estrogen receptor positive status [ER+]: Secondary | ICD-10-CM | POA: Insufficient documentation

## 2023-08-02 DIAGNOSIS — C50511 Malignant neoplasm of lower-outer quadrant of right female breast: Secondary | ICD-10-CM | POA: Diagnosis not present

## 2023-08-02 DIAGNOSIS — Z806 Family history of leukemia: Secondary | ICD-10-CM | POA: Insufficient documentation

## 2023-08-02 DIAGNOSIS — Z79899 Other long term (current) drug therapy: Secondary | ICD-10-CM | POA: Diagnosis not present

## 2023-08-02 LAB — CMP (CANCER CENTER ONLY)
ALT: 14 U/L (ref 0–44)
AST: 18 U/L (ref 15–41)
Albumin: 4.3 g/dL (ref 3.5–5.0)
Alkaline Phosphatase: 75 U/L (ref 38–126)
Anion gap: 6 (ref 5–15)
BUN: 20 mg/dL (ref 8–23)
CO2: 29 mmol/L (ref 22–32)
Calcium: 9.2 mg/dL (ref 8.9–10.3)
Chloride: 105 mmol/L (ref 98–111)
Creatinine: 0.91 mg/dL (ref 0.44–1.00)
GFR, Estimated: >60 mL/min (ref >60)
Glucose, Bld: 93 mg/dL (ref 70–99)
Potassium: 4.2 mmol/L (ref 3.5–5.1)
Sodium: 140 mmol/L (ref 135–145)
Total Bilirubin: 0.3 mg/dL (ref 0.3–1.2)
Total Protein: 6.8 g/dL (ref 6.5–8.1)

## 2023-08-02 LAB — CBC WITH DIFFERENTIAL (CANCER CENTER ONLY)
Abs Immature Granulocytes: 0.01 10*3/uL (ref 0.00–0.07)
Basophils Absolute: 0.1 10*3/uL (ref 0.0–0.1)
Basophils Relative: 1 %
Eosinophils Absolute: 0.1 10*3/uL (ref 0.0–0.5)
Eosinophils Relative: 2 %
HCT: 38.4 % (ref 36.0–46.0)
Hemoglobin: 13.6 g/dL (ref 12.0–15.0)
Immature Granulocytes: 0 %
Lymphocytes Relative: 39 %
Lymphs Abs: 2.7 10*3/uL (ref 0.7–4.0)
MCH: 32.2 pg (ref 26.0–34.0)
MCHC: 35.4 g/dL (ref 30.0–36.0)
MCV: 90.8 fL (ref 80.0–100.0)
Monocytes Absolute: 0.5 10*3/uL (ref 0.1–1.0)
Monocytes Relative: 8 %
Neutro Abs: 3.4 10*3/uL (ref 1.7–7.7)
Neutrophils Relative %: 50 %
Platelet Count: 235 10*3/uL (ref 150–400)
RBC: 4.23 MIL/uL (ref 3.87–5.11)
RDW: 12.9 % (ref 11.5–15.5)
WBC Count: 6.8 10*3/uL (ref 4.0–10.5)
nRBC: 0 % (ref 0.0–0.2)

## 2023-08-02 LAB — GENETIC SCREENING ORDER

## 2023-08-02 NOTE — Progress Notes (Signed)
Angelica Hartman CONSULT NOTE  Patient Care Team: Clovis Riley, L.August Saucer, MD as PCP - General (Family Medicine) Donnelly Angelica, RN as Oncology Nurse Navigator Pershing Proud, RN as Oncology Nurse Navigator Serena Croissant, MD as Consulting Physician (Hematology and Oncology) Antony Blackbird, MD as Consulting Physician (Radiation Oncology) Emelia Loron, MD as Consulting Physician (General Surgery)  CHIEF COMPLAINTS/PURPOSE OF CONSULTATION:  Newly diagnosed breast cancer  HISTORY OF PRESENTING ILLNESS:  Angelica Hartman 67 y.o. female is here because of recent diagnosis of right breast cancer.  Patient had a palpable right breast lump which upon mammogram and ultrasound measured 0.7 cm at 7 o'clock position.  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  She was presented this morning to the multidiscipline tumor board and she is here today to discuss treatment plan.  I reviewed her records extensively and collaborated the history with the patient.  SUMMARY OF ONCOLOGIC HISTORY: Oncology History  Malignant neoplasm of lower-outer quadrant of right breast of female, estrogen receptor positive (HCC)  07/25/2023 Initial Diagnosis   Palpable right breast lump by ultrasound measured 0.7 cm at 7 o'clock position, biopsy: Grade 2 IDC ER 90%, PR 60%, Ki67 5%, FISH negative for HER2      MEDICAL HISTORY:  Past Medical History:  Diagnosis Date   Breast cancer (HCC)    History of Helicobacter pylori infection    Seasonal allergies     SURGICAL HISTORY: Past Surgical History:  Procedure Laterality Date   ABDOMINAL SURGERY     BREAST LUMPECTOMY Right     SOCIAL HISTORY: Social History   Socioeconomic History   Marital status: Married    Spouse name: Not on file   Number of children: Not on file   Years of education: Not on file   Highest education level: Not on file  Occupational History   Not on file  Tobacco Use    Smoking status: Former   Smokeless tobacco: Never  Substance and Sexual Activity   Alcohol use: No   Drug use: No   Sexual activity: Not on file  Other Topics Concern   Not on file  Social History Narrative   Not on file   Social Determinants of Health   Financial Resource Strain: Not on file  Food Insecurity: Not on file  Transportation Needs: Not on file  Physical Activity: Not on file  Stress: Not on file  Social Connections: Not on file  Intimate Partner Violence: Not on file    FAMILY HISTORY: Family History  Problem Relation Age of Onset   Alzheimer's disease Mother    Leukemia Brother 83    ALLERGIES:  is allergic to codeine and penicillins.  MEDICATIONS:  Current Outpatient Medications  Medication Sig Dispense Refill   atorvastatin (LIPITOR) 10 MG tablet Take 10 mg by mouth daily.     FYAVOLV 0.5-2.5 MG-MCG tablet      LORazepam (ATIVAN) 0.5 MG tablet Take 0.5 mg by mouth 2 (two) times daily as needed for anxiety.      omeprazole (PRILOSEC) 40 MG capsule omeprazole 40 mg capsule,delayed release     sertraline (ZOLOFT) 25 MG tablet Take 1 tablet (25 mg total) by mouth daily. Increase to 2 tablets and 7 days. (Patient taking differently: Take 25 mg by mouth daily.) 60 tablet 1   cetirizine (ZYRTEC) 10 MG tablet Take 1 tablet (10 mg total) by mouth daily. 30 tablet 0   diclofenac (  VOLTAREN) 50 MG EC tablet diclofenac sodium 50 mg tablet,delayed release  TAKE 1 TABLET BY MOUTH TWICE DAILY WITH FOOD     dicyclomine (BENTYL) 20 MG tablet Take 20 mg by mouth 3 (three) times daily as needed for muscle spasms.     fluticasone (FLONASE) 50 MCG/ACT nasal spray Place into both nostrils.     meloxicam (MOBIC) 15 MG tablet Take 1 tablet (15 mg total) by mouth daily. 30 tablet 3   polyethylene glycol (MIRALAX / GLYCOLAX) packet Take 17 g by mouth every other day.     triamcinolone cream (KENALOG) 0.1 % triamcinolone acetonide 0.1 % topical cream  APPLY AA ON THE SKIN 2-3 XD  FOR 2 WEEKS. MIX WITH CERAVE CREAM UTD     valACYclovir (VALTREX) 1000 MG tablet valacyclovir 1 gram tablet     No current facility-administered medications for this visit.    REVIEW OF SYSTEMS:   Constitutional: Denies fevers, chills or abnormal night sweats   All other systems were reviewed with the patient and are negative.  PHYSICAL EXAMINATION: ECOG PERFORMANCE STATUS: 1 - Symptomatic but completely ambulatory  Vitals:   08/02/23 1220  BP: 116/70  Pulse: 78  Resp: 18  Temp: 97.7 F (36.5 C)  SpO2: 98%   Filed Weights   08/02/23 1220  Weight: 150 lb 4.8 oz (68.2 kg)    GENERAL:alert, no distress and comfortable    LABORATORY DATA:  I have reviewed the data as listed Lab Results  Component Value Date   WBC 6.8 08/02/2023   HGB 13.6 08/02/2023   HCT 38.4 08/02/2023   MCV 90.8 08/02/2023   PLT 235 08/02/2023   Lab Results  Component Value Date   NA 140 08/02/2023   K 4.2 08/02/2023   CL 105 08/02/2023   CO2 29 08/02/2023    RADIOGRAPHIC STUDIES: I have personally reviewed the radiological reports and agreed with the findings in the report.  ASSESSMENT AND PLAN:  Malignant neoplasm of lower-outer quadrant of right breast of female, estrogen receptor positive (HCC) 07/25/2023: Palpable right breast lump by ultrasound measured 0.7 cm at 7 o'clock position, biopsy: Grade 2 IDC ER 90%, PR 60%, Ki67 5%, FISH negative for HER2  Pathology and radiology counseling:Discussed with the patient, the details of pathology including the type of breast cancer,the clinical staging, the significance of ER, PR and HER-2/neu receptors and the implications for treatment. After reviewing the pathology in detail, we proceeded to discuss the different treatment options between surgery, radiation, chemotherapy, antiestrogen therapies.  Recommendations: 1. Breast conserving surgery followed by 2. Oncotype DX testing to determine if chemotherapy would be of any benefit followed by 3.  Adjuvant radiation therapy followed by 4. Adjuvant antiestrogen therapy  Oncotype counseling: I discussed Oncotype DX test. I explained to the patient that this is a 21 gene panel to evaluate patient tumors DNA to calculate recurrence score. This would help determine whether patient has high risk or low risk breast cancer. She understands that if her tumor was found to be high risk, she would benefit from systemic chemotherapy. If low risk, no need of chemotherapy.  Return to clinic after surgery to discuss final pathology report and then determine if Oncotype DX testing will need to be sent.   All questions were answered. The patient knows to call the clinic with any problems, questions or concerns.    Tamsen Meek, MD 08/02/23

## 2023-08-02 NOTE — Assessment & Plan Note (Signed)
07/25/2023: Palpable right breast lump by ultrasound measured 0.7 cm at 7 o'clock position, biopsy: Grade 2 IDC ER 90%, PR 60%, Ki67 5%, FISH negative for HER2  Pathology and radiology counseling:Discussed with the patient, the details of pathology including the type of breast cancer,the clinical staging, the significance of ER, PR and HER-2/neu receptors and the implications for treatment. After reviewing the pathology in detail, we proceeded to discuss the different treatment options between surgery, radiation, chemotherapy, antiestrogen therapies.  Recommendations: 1. Breast conserving surgery followed by 2. Oncotype DX testing to determine if chemotherapy would be of any benefit followed by 3. Adjuvant radiation therapy followed by 4. Adjuvant antiestrogen therapy  Oncotype counseling: I discussed Oncotype DX test. I explained to the patient that this is a 21 gene panel to evaluate patient tumors DNA to calculate recurrence score. This would help determine whether patient has high risk or low risk breast cancer. She understands that if her tumor was found to be high risk, she would benefit from systemic chemotherapy. If low risk, no need of chemotherapy.  Return to clinic after surgery to discuss final pathology report and then determine if Oncotype DX testing will need to be sent.

## 2023-08-02 NOTE — Research (Signed)
[employer] 2021-05 - Specimen Collection Study to Evaluate Biomarkers in Subjects with Cancer    Patient Angelica Hartman was identified by Dr. Imogene Burn as a potential candidate for the above listed study.  This Clinical Research Coordinator met with Angelica Hartman, [id number], on 08/02/23 in a manner and location that ensures patient privacy to discuss participation in the above listed research study.  Patient is Accompanied by her husband .  A copy of the informed consent document with embedded HIPAA language was provided to the patient.  Patient reads, speaks, and understands [place].   Patient was provided with the business card of this Coordinator and encouraged to contact the research team with any questions.  Approximately 15 minutes were spent with the patient reviewing the informed consent documents.  Patient was provided the option of taking informed consent documents home to review and was encouraged to review at their convenience with their support network, including other care providers. Patient took the consent documents home to review.  Will meet with the patient on Friday 08/04/2023, to complete all study procedures.  Felecia Jan, [facility] 08/02/2023 4:48 PM

## 2023-08-02 NOTE — Telephone Encounter (Signed)
Ms. Lorig was seen by a genetic counselor during the breast multidisciplinary clinic on 08/02/2023. She reports her brother was diagnosed with CLL at age 65. She does not meet NCCN criteria for genetic testing at this time. She was still offered genetic counseling and testing but declined. We encourage her to contact us if there are any changes to her personal or family history of cancer. If she meets NCCN criteria based on the updated personal/family history, she would be recommended to have genetic counseling and testing.   Lalla Brothers, MS, [facility] Genetic Counselor [email or web address] (P) [phone]

## 2023-08-02 NOTE — Progress Notes (Signed)
Radiation Oncology         [phone] ________________________________  Multidisciplinary Breast Oncology Clinic [facility] [address]) Initial Outpatient Consultation  Name: Angelica Hartman MRN: [id number]  Date: 08/02/2023  DOB: January 26, 1956  AC:ZYSAYTKZ, L.August Saucer, MD  Emelia Loron, MD   REFERRING PHYSICIAN: Emelia Loron, MD  DIAGNOSIS: The encounter diagnosis was Malignant neoplasm of lower-outer quadrant of right breast of female, estrogen receptor positive (HCC).  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 Malignant neoplasm of lower-outer quadrant of right breast of female, estrogen receptor positive (HCC)  C50.511    Z17.0       HISTORY OF PRESENT ILLNESS::Angelica Hartman is a 67 y.o. female who is presenting to the office today for evaluation of her newly diagnosed breast cancer. She is accompanied by her husband. She is doing well overall.   She presented with a small palpable right lower outer breast mass which has been present for 1 year. She subsequently underwent bilateral diagnostic mammography with tomography and right breast ultrasonography at [facility] on 07/21/23 which showed a 0.7 cm mass in the 7 o'clock right breast correlating with the palpable abnormality, at a posterior depth, located 5 cmfn. No evidence of right axillary lymphadenopathy was appreciated.   Biopsy of the 7 o'clock right breast on 07/25/23 showed grade 2 invasive mammary/ductal carcinoma measuring 0.6 cm in the greatest linear extent of the sample with low grade MCIS.. Prognostic indicators significant for: estrogen receptor, 90% positive with strong staining intensity and progesterone receptor, 60% positive with moderate-strong staining intensity Proliferation marker Ki67 at 5%. HER2 negative.  Menarche: 67 years old GP: 0 LMP: postmenopausal (LMP date not indicated on the provided form) Contraceptive: never  used HRT: yes, used for "years" and stopped in January 2024 due to hot flashes.    The patient was referred today for presentation in the multidisciplinary conference.  Radiology studies and pathology slides were presented there for review and discussion of treatment options.  A consensus was discussed regarding potential next steps.  PREVIOUS RADIATION THERAPY: No  PAST MEDICAL HISTORY:  Past Medical History:  Diagnosis Date   Breast cancer (HCC)    History of Helicobacter pylori infection    Seasonal allergies     PAST SURGICAL HISTORY: Past Surgical History:  Procedure Laterality Date   ABDOMINAL SURGERY     BREAST LUMPECTOMY Right     FAMILY HISTORY:  Family History  Problem Relation Age of Onset   Alzheimer's disease Mother    Leukemia Brother 52    SOCIAL HISTORY:  Social History   Socioeconomic History   Marital status: Married    Spouse name: Not on file   Number of children: Not on file   Years of education: Not on file   Highest education level: Not on file  Occupational History   Not on file  Tobacco Use   Smoking status: Former   Smokeless tobacco: Never  Substance and Sexual Activity   Alcohol use: No   Drug use: No   Sexual activity: Not on file  Other Topics Concern   Not on file  Social History Narrative   Not on file   Social Determinants of Health   Financial Resource Strain: Not on file  Food Insecurity: No Food Insecurity (08/02/2023)   Hunger Vital Sign    Worried About Running Out of Food in the Last Year: Never  true    Ran Out of Food in the Last Year: Never true  Transportation Needs: No Transportation Needs (08/02/2023)   PRAPARE - Administrator, Civil Service (Medical): No    Lack of Transportation (Non-Medical): No  Physical Activity: Not on file  Stress: Not on file  Social Connections: Not on file    ALLERGIES:  Allergies  Allergen Reactions   Codeine Nausea Only   Penicillins Rash    MEDICATIONS:   Current Outpatient Medications  Medication Sig Dispense Refill   atorvastatin (LIPITOR) 10 MG tablet Take 10 mg by mouth daily.     cetirizine (ZYRTEC) 10 MG tablet Take 1 tablet (10 mg total) by mouth daily. 30 tablet 0   diclofenac (VOLTAREN) 50 MG EC tablet diclofenac sodium 50 mg tablet,delayed release  TAKE 1 TABLET BY MOUTH TWICE DAILY WITH FOOD     dicyclomine (BENTYL) 20 MG tablet Take 20 mg by mouth 3 (three) times daily as needed for muscle spasms.     fluticasone (FLONASE) 50 MCG/ACT nasal spray Place into both nostrils.     FYAVOLV 0.5-2.5 MG-MCG tablet      LORazepam (ATIVAN) 0.5 MG tablet Take 0.5 mg by mouth 2 (two) times daily as needed for anxiety.      meloxicam (MOBIC) 15 MG tablet Take 1 tablet (15 mg total) by mouth daily. 30 tablet 3   omeprazole (PRILOSEC) 40 MG capsule omeprazole 40 mg capsule,delayed release     polyethylene glycol (MIRALAX / GLYCOLAX) packet Take 17 g by mouth every other day.     sertraline (ZOLOFT) 25 MG tablet Take 1 tablet (25 mg total) by mouth daily. Increase to 2 tablets and 7 days. (Patient taking differently: Take 25 mg by mouth daily.) 60 tablet 1   triamcinolone cream (KENALOG) 0.1 % triamcinolone acetonide 0.1 % topical cream  APPLY AA ON THE SKIN 2-3 XD FOR 2 WEEKS. MIX WITH CERAVE CREAM UTD     valACYclovir (VALTREX) 1000 MG tablet valacyclovir 1 gram tablet     No current facility-administered medications for this encounter.    REVIEW OF SYSTEMS: A 10+ POINT REVIEW OF SYSTEMS WAS OBTAINED including neurology, dermatology, psychiatry, cardiac, respiratory, lymph, extremities, GI, GU, musculoskeletal, constitutional, reproductive, HEENT. On the provided form, she reports wearing glasses, runny nose, a palpable breast lump, bruising easily, and hot flashes. She denies any other symptoms.    PHYSICAL EXAM:     08/02/2023  Vitals with BMI   Height 5\' 1"    Weight 150 lbs 5 oz   BMI 28.41   Systolic 116   Diastolic 70   Pulse 78     Lungs are clear to auscultation bilaterally. Heart has regular rate and rhythm. No palpable cervical, supraclavicular, or axillary adenopathy. Abdomen soft, non-tender, normal bowel sounds. Breast: Left breast with no palpable mass, nipple discharge, or bleeding. Right breast with biopsy site in the inferior aspect of the breast with a 1 cm mass in the 7 o'clock breast. No nipple discharge or bleeding appreciated.   KPS = 100  100 - Normal; no complaints; no evidence of disease. 90   - Able to carry on normal activity; minor signs or symptoms of disease. 80   - Normal activity with effort; some signs or symptoms of disease. 41   - Cares for self; unable to carry on normal activity or to do active work. 60   - Requires occasional assistance, but is able to care for most of his  personal needs. 50   - Requires considerable assistance and frequent medical care. 40   - Disabled; requires special care and assistance. 30   - Severely disabled; hospital admission is indicated although death not imminent. 20   - Very sick; hospital admission necessary; active supportive treatment necessary. 10   - Moribund; fatal processes progressing rapidly. 0     - Dead  Karnofsky DA, Abelmann WH, Craver LS and Burchenal [facility] [phone]) The use of the nitrogen mustards in the palliative treatment of carcinoma: with particular reference to bronchogenic carcinoma Cancer 1 634-56  LABORATORY DATA:  Lab Results  Component Value Date   WBC 6.8 08/02/2023   HGB 13.6 08/02/2023   HCT 38.4 08/02/2023   MCV 90.8 08/02/2023   PLT 235 08/02/2023   Lab Results  Component Value Date   NA 140 08/02/2023   K 4.2 08/02/2023   CL 105 08/02/2023   CO2 29 08/02/2023   Lab Results  Component Value Date   ALT 14 08/02/2023   AST 18 08/02/2023   ALKPHOS 75 08/02/2023   BILITOT 0.3 08/02/2023    PULMONARY FUNCTION TEST:   Review Flowsheet        No data to display          RADIOGRAPHY: No results found.     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 We discussed the general course of radiation, potential side effects, and toxicities with radiation and the patient is interested in this approach.   PLAN:  Right lumpectomy  Oncotype  Adjuvant radiation therapy  Aromatase inhibitor    ------------------------------------------------  Billie Lade, PhD, MD  This document serves as a record of services personally performed by Antony Blackbird, MD. It was created on his behalf by Neena Rhymes, a trained medical scribe. The creation of this record is based on the scribe's personal observations and the provider's statements to them. This document has been checked and approved by the attending provider.

## 2023-08-02 NOTE — Progress Notes (Signed)
[facility] Multidisciplinary Clinic Spiritual Care Note  Met with Angelica Hartman and her husband in Breast Multidisciplinary Clinic to introduce Support Center team/resources.  She completed SDOH screening; results follow below.    SDOH Screenings   Food Insecurity: No Food Insecurity (08/02/2023)  Housing: Low Risk  (08/02/2023)  Transportation Needs: No Transportation Needs (08/02/2023)  Utilities: Not At Risk (08/02/2023)  Depression (PHQ2-9): Low Risk  (08/02/2023)  Tobacco Use: Medium Risk (08/02/2023)     Chaplain and patient discussed common feelings and emotions when being diagnosed with cancer, and the importance of support during treatment.  Chaplain informed patient of the support team and support services at [facility].  Chaplain provided contact information and encouraged patient to call with any questions or concerns.  Angelica Hartman notes that she feels relieved at learning more about diagnosis, while recognizing that she still has to go through treatment itself. Provided empathic listening, normalization of feelings, and emotional support.  Follow up needed: No. Angelica Hartman plans to contact Spiritual Care as needed/desired.   [address] [place], [place], [facility] Pager [phone] Voicemail [phone]

## 2023-08-04 ENCOUNTER — Inpatient Hospital Stay: Payer: Medicare HMO

## 2023-08-04 DIAGNOSIS — Z170 Estrogen receptor positive status [ER+]: Secondary | ICD-10-CM

## 2023-08-04 LAB — RESEARCH LABS

## 2023-08-04 NOTE — Research (Signed)
[employer] 2021-05 - Specimen Collection Study to Evaluate Biomarkers in Subjects with Cancer   This Nurse has reviewed this patient's inclusion and exclusion criteria as a second review and confirms Angelica Hartman is eligible for study participation.  Patient may continue with enrollment.  [name], RN, BSN, [facility]  Her  Hers Clinical Research Nurse [facility] Direct Dial [phone]  Pager [phone] 08/04/2023 8:59 AM

## 2023-08-04 NOTE — Research (Signed)
[employer] 2021-05 - Specimen Collection Study to Evaluate Biomarkers in Subjects with Cancer    Patient Angelica Hartman was identified by Dr. Pamelia Hoit as a potential candidate for the above listed study.  This Clinical Research Coordinator met with Angelica Hartman, Angelica Hartman on 08/04/23 in a manner and location that ensures patient privacy to discuss participation in the above listed research study.  Patient is Unaccompanied.  Patient was previously provided with informed consent documents.  Patient confirmed they have read the informed consent documents.  As outlined in the informed consent form, this Coordinator and Angelica Hartman discussed the purpose of the research study, the investigational nature of the study, study procedures and requirements for study participation, potential risks and benefits of study participation, as well as alternatives to participation.  This study is not blinded or double-blinded. The patient understands participation is voluntary and they may withdraw from study participation at any time.  This study does not involve randomization.  This study does not involve an investigational drug or device. This study does not involve a placebo. Patient understands enrollment is pending full eligibility review.   Confidentiality and how the patient's information will be used as part of study participation were discussed.  Patient was informed there is not reimbursement provided for their time and effort spent on trial participation.  The patient is encouraged to discuss research study participation with their insurance provider to determine what costs they may incur as part of study participation, including research related injury.    All questions were answered to patient's satisfaction.  The informed consent with embedded HIPAA language was reviewed page by page.  The patient's mental and emotional status is appropriate to provide informed consent, and the patient verbalizes an  understanding of study participation.  Patient has agreed to participate in the above listed research study and has voluntarily signed the informed consent version [employer] Approved Version 18 Dec 2020 with embedded HIPAA language, version 3  on 08/04/23 at 9:00AM.  The patient was provided with a copy of the signed informed consent form with embedded HIPAA language for their reference.  No study specific procedures were obtained prior to the signing of the informed consent document.  Approximately 30 minutes were spent with the patient reviewing the informed consent documents.  Patient was not requested to complete a Release of Information form.   This Coordinator has reviewed this patient's inclusion and exclusion criteria and confirmed Angelica Hartman is eligible for study participation.  Patient will continue with enrollment.  Menopausal status (women only): Angelica Hartman is postmenopausal    Eligibility confirmed by treating investigator, who also agrees that patient should proceed with enrollment.  Medical History:  High Blood Pressure  No Coronary Artery Disease No Lupus    No Rheumatoid Arthritis  No Diabetes   No      If yes, which type?      N/a Lynch Syndrome  No  Is the patient currently taking a magnesium supplement?   Yes If yes, dose and frequency? 4 a day  Indication? For better resting and sleeping  Start date? August 2023   Does the patient have a personal history of cancer (greater than 5 years ago)?  No If yes, Cancer type and date of diagnosis?   N/a  Has this previous diagnosis been treated? N/a      If so, treatment type? N/a   Start and end dates of last treatment cycle? N/a  Does the patient  have a family history of cancer in 1st or 2nd degree relatives? No If yes, Relationship(s) and Cancer type(s)? N/a  Does the patient have history of alcohol consumption? Yes   If yes, current or former? Current  If former, year stopped? N/a Number of years? 30 Drinks  per week? Couple of glasses a month   Does the patient have history of cigarette, cigar, pipe, or chewing tobacco use?  Yes  If yes, current for former? former If yes, type (Cigarette, cigar, pipe, and/or chewing tobacco)? Cigarettes    If former, year stopped? 30 years ago  Number of years? 20 Packs/number/containers per day? Half a pack a day    Angelica Hartman, CRC 08/04/2023 9:10 AM

## 2023-08-08 ENCOUNTER — Encounter: Payer: Self-pay | Admitting: *Deleted

## 2023-08-09 ENCOUNTER — Other Ambulatory Visit: Payer: Self-pay

## 2023-08-09 ENCOUNTER — Encounter (HOSPITAL_COMMUNITY): Payer: Self-pay | Admitting: General Surgery

## 2023-08-09 DIAGNOSIS — R928 Other abnormal and inconclusive findings on diagnostic imaging of breast: Secondary | ICD-10-CM | POA: Diagnosis not present

## 2023-08-09 DIAGNOSIS — C50911 Malignant neoplasm of unspecified site of right female breast: Secondary | ICD-10-CM | POA: Diagnosis not present

## 2023-08-09 NOTE — Progress Notes (Signed)
Anesthesia Chart Review: Angelica Hartman  Case: [id number] Date/Time: 08/10/23 0715   Procedure: RIGHT BREAST SEED GUIDED LUMPECTOMY (Right)   Anesthesia type: General   Pre-op diagnosis: RIGHT BREAST CANCER   [facility] ROOM [facility] / [facility] OR   Surgeons: Emelia Loron, MD       DISCUSSION: Patient is a 67 year old female scheduled for the above procedure.  History includes former smoker, breast cancer (07/25/23 right breast biopsy: invasive mammary carcinoma, mammary carcinoma in situ)  RSL scheduled for 08/09/2023 at 9:30 AM.  Anesthesia team to evaluate on the day of surgery.  VS:  Wt Readings from Last 3 Encounters:  08/02/23 68.2 kg  08/19/19 67.1 kg  09/07/17 67.1 kg   Temp Readings from Last 3 Encounters:  08/02/23 36.5 C (Temporal)  08/19/19 37.1 C (Oral)  07/25/19 36.7 C   BP Readings from Last 3 Encounters:  08/02/23 116/70  08/19/19 126/71  04/11/19 115/63   Pulse Readings from Last 3 Encounters:  08/02/23 78  08/19/19 65  04/11/19 77     PROVIDERS: Milus Height, PA is PCP  Serena Croissant, MD is Heron Nay, MD is RAD-ONC  LABS: Last lab results in [facility] include: Lab Results  Component Value Date   WBC 6.8 08/02/2023   HGB 13.6 08/02/2023   HCT 38.4 08/02/2023   PLT 235 08/02/2023   GLUCOSE 93 08/02/2023   ALT 14 08/02/2023   AST 18 08/02/2023   NA 140 08/02/2023   K 4.2 08/02/2023   CL 105 08/02/2023   CREATININE 0.91 08/02/2023   BUN 20 08/02/2023   CO2 29 08/02/2023   INR 1.0 08/19/2019     EKG: EKG 08/19/19: Sinus rhythm Nonspecific intraventricular conduction delay Low voltage, precordial leads Confirmed by Margarita Grizzle [phone]) on 08/21/2019 6:30:23 PM  CV: N/A  Past Medical History:  Diagnosis Date   Breast cancer (HCC)    History of Helicobacter pylori infection    Seasonal allergies     Past Surgical History:  Procedure Laterality Date   ABDOMINAL SURGERY     BREAST LUMPECTOMY Right     MEDICATIONS: No  current facility-administered medications for this encounter.    atorvastatin (LIPITOR) 10 MG tablet   cetirizine (ZYRTEC) 10 MG tablet   ibuprofen (ADVIL) 200 MG tablet   LORazepam (ATIVAN) 0.5 MG tablet   MAGNESIUM GLUCONATE PO   Omeprazole 20 MG TBDD   Probiotic Product (PROBIOTIC PO)   sertraline (ZOLOFT) 25 MG tablet   vitamin C (ASCORBIC ACID) 250 MG tablet     Shonna Chock, PA-C Surgical Short Stay/Anesthesiology [facility] Phone [phone] [facility] Phone [phone] 08/09/2023 9:56 AM

## 2023-08-09 NOTE — Progress Notes (Signed)
SDW call  Patient was given pre-op instructions over the phone. Patient verbalized understanding of instructions provided.     PCP - Milus Height, PA Cardiologist -  Pulmonary:    PPM/ICD - denies Device Orders - n/a Rep Notified - n/a   Chest x-ray - n/a EKG -  n/a Stress Test - ECHO -  Cardiac Cath -   Sleep Study/sleep apnea/CPAP: denies  Non-diabetic  Blood Thinner Instructions: denies Aspirin Instructions:denies   ERAS Protcol - Yes, clear fluids until 0430   COVID TEST- n/a    Anesthesia review: Yes. Seed placement   Patient denies shortness of breath, fever, cough and chest pain over the phone call  Your procedure is scheduled on Thursday August 10, 2023  Report to [facility] Main Entrance "A" at 0530 A.M., then check in with the Admitting office.  Call this number if you have problems the morning of surgery:  [phone]   If you have any questions prior to your surgery date call [phone]: Open Monday-Friday 8am-4pm If you experience any cold or flu symptoms such as cough, fever, chills, shortness of breath, etc. between now and your scheduled surgery, please notify us at the above number     Remember:  Do not eat after midnight the night before your surgery  You may drink clear liquids until 0430  the morning of your surgery.   Clear liquids allowed are: Water, Non-Citrus Juices (without pulp), Carbonated Beverages, Clear Tea, Black Coffee ONLY (NO MILK, CREAM OR POWDERED CREAMER of any kind), and Gatorade   Take these medicines the morning of surgery with A SIP OF WATER:  Atorvastatin, zyrtec, omeprazole, zoloft  As needed: ativan  As of today, STOP taking any Aspirin (unless otherwise instructed by your surgeon) Aleve, Naproxen, Ibuprofen, Motrin, Advil, Goody's, BC's, all herbal medications, fish oil, and all vitamins.

## 2023-08-09 NOTE — Anesthesia Preprocedure Evaluation (Addendum)
Anesthesia Evaluation  Patient identified by MRN, date of birth, ID band Patient awake    Reviewed: Allergy & Precautions, NPO status , Patient's Chart, lab work & pertinent test results  History of Anesthesia Complications Negative for: history of anesthetic complications  Airway Mallampati: III  TM Distance: >3 FB Neck ROM: Full    Dental  (+) Dental Advisory Given   Pulmonary neg shortness of breath, neg sleep apnea, neg COPD, neg recent URI, former smoker   Pulmonary exam normal breath sounds clear to auscultation       Cardiovascular (-) hypertension(-) angina (-) Past MI and (-) Cardiac Stents (-) dysrhythmias  Rhythm:Regular Rate:Normal  HLD   Neuro/Psych negative neurological ROS     GI/Hepatic Neg liver ROS,GERD  Medicated,,  Endo/Other  negative endocrine ROS    Renal/GU negative Renal ROS     Musculoskeletal   Abdominal   Peds  Hematology negative hematology ROS (+)   Anesthesia Other Findings   Reproductive/Obstetrics Right breast cancer                             Anesthesia Physical Anesthesia Plan  ASA: 2  Anesthesia Plan: General   Post-op Pain Management: Tylenol PO (pre-op)*   Induction: Intravenous  PONV Risk Score and Plan: 3 and Ondansetron and Dexamethasone  Airway Management Planned: LMA  Additional Equipment:   Intra-op Plan:   Post-operative Plan: Extubation in OR  Informed Consent: I have reviewed the patients History and Physical, chart, labs and discussed the procedure including the risks, benefits and alternatives for the proposed anesthesia with the patient or authorized representative who has indicated his/her understanding and acceptance.     Dental advisory given  Plan Discussed with: CRNA and Anesthesiologist  Anesthesia Plan Comments: (PAT note written 08/09/2023 by Shonna Chock, PA-C.  Risks of general anesthesia discussed  including, but not limited to, sore throat, hoarse voice, chipped/damaged teeth, injury to vocal cords, nausea and vomiting, allergic reactions, lung infection, heart attack, stroke, and death. All questions answered.   )       Anesthesia Quick Evaluation

## 2023-08-09 NOTE — H&P (Signed)
49 yof retired who has mass right lower outer quadrant since January. She has no nipple dc. No family history. Has prior right breast benign biopsy. There is mass in central breast. No mm correlate. On [place] there is a 7x5x6 mm mass. Axillary [place] is negative. 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 She is here to discuss options  Review of Systems: A complete review of systems was obtained from the patient. I have reviewed this information and discussed as appropriate with the patient. See HPI as well for other ROS.  Review of Systems  All other systems reviewed and are negative.  Medical History: Past Medical History:  Diagnosis Date  GERD (gastroesophageal reflux disease)  History of cancer  Hyperlipidemia   Patient Active Problem List  Diagnosis  Malignant neoplasm of lower-outer quadrant of right breast of female, estrogen receptor positive (CMS/HHS-HCC)   Past Surgical History:  Procedure Laterality Date  abdominal surgery N/A  Right Breast Lumpectomy Right   Allergies  Allergen Reactions  Latex Other (See Comments)  Codeine Nausea and Other (See Comments)  Nausea  Penicillins Rash   Current Outpatient Medications on File Prior to Visit  Medication Sig Dispense Refill  atorvastatin (LIPITOR) 10 MG tablet Take 10 mg by mouth once daily  fluticasone propionate (FLONASE) 50 mcg/actuation nasal spray Place 1 spray into both nostrils once daily as needed for Allergies or Rhinitis  LORazepam (ATIVAN) 0.5 MG tablet Take 0.5 mg by mouth once daily as needed for Anxiety  sertraline (ZOLOFT) 25 MG tablet Take 25 mg by mouth once daily    Family History  Problem Relation Age of Onset  High blood pressure (Hypertension) Brother  Hyperlipidemia (Elevated cholesterol) Brother    Social History   Tobacco Use  Smoking Status Former  Types: Cigarettes  Smokeless Tobacco Never  Tobacco Comments  Smoked 30 years ago  Marital status:  Married  Tobacco Use  Smoking status: Former  Types: Cigarettes  Smokeless tobacco: Never  Tobacco comments:  Smoked 30 years ago  Vaping Use  Vaping status: Never Used  Substance and Sexual Activity  Alcohol use: Yes  Drug use: Never  Sexual activity: Defer   Objective:   Physical Exam Vitals reviewed.  Constitutional:  Appearance: Normal appearance.  Chest:  Breasts: Right: No inverted nipple, mass or nipple discharge.  Left: No inverted nipple, mass or nipple discharge.  Lymphadenopathy:  Upper Body:  Right upper body: No supraclavicular or axillary adenopathy.  Left upper body: No supraclavicular or axillary adenopathy.  Neurological:  Mental Status: She is alert.   Assessment and Plan:   Malignant neoplasm of lower-outer quadrant of right breast of female, estrogen receptor positive (CMS/HHS-HCC)  Right breast seed guided lumpectomy  We discussed the staging and pathophysiology of breast cancer. We discussed all of the different options for treatment for breast cancer including surgery, chemotherapy, radiation therapy, Herceptin, and antiestrogen therapy.  We discussed a sentinel lymph node biopsy today. We discussed choosing wisely for over 70 as well as the SOUND trial. After discussing the pros cons in a small er pos tumor we have elected to omit sn biopsy. Med onc and rad onc are agreeable to this as well.  We discussed the options for treatment of the breast cancer which included lumpectomy versus a mastectomy. We discussed the performance of the lumpectomy with radioactive seed placement. We discussed a 5% chance of a positive margin requiring reexcision  in the operating room. We also discussed that she will be recommended radiation therapy if she undergoes lumpectomy. We discussed mastectomy and the postoperative care for that as well. Mastectomy can be followed by reconstruction. The decision for lumpectomy vs mastectomy has no impact on decision for  chemotherapy. Most mastectomy patients will not need radiation therapy. We discussed that there is no difference in her survival whether she undergoes lumpectomy with radiation therapy or antiestrogen therapy versus a mastectomy. There is also no real difference between her recurrence in the breast.  We discussed the risks of operation including bleeding, infection, possible reoperation. She understands her further therapy will be based on what her stages at the time of her operation.

## 2023-08-10 ENCOUNTER — Encounter (HOSPITAL_COMMUNITY): Payer: Self-pay | Admitting: General Surgery

## 2023-08-10 ENCOUNTER — Other Ambulatory Visit: Payer: Self-pay

## 2023-08-10 ENCOUNTER — Ambulatory Visit (HOSPITAL_COMMUNITY)
Admission: RE | Admit: 2023-08-10 | Discharge: 2023-08-10 | Disposition: A | Discharge status: Home / Self Care | Payer: Medicare HMO | Source: Ambulatory Visit | Attending: General Surgery | Admitting: General Surgery

## 2023-08-10 ENCOUNTER — Ambulatory Visit (HOSPITAL_BASED_OUTPATIENT_CLINIC_OR_DEPARTMENT_OTHER): Payer: Medicare HMO | Admitting: Vascular Surgery

## 2023-08-10 ENCOUNTER — Encounter (HOSPITAL_COMMUNITY)
Admission: RE | Disposition: A | Discharge status: Home / Self Care | Payer: Self-pay | Source: Ambulatory Visit | Attending: General Surgery

## 2023-08-10 ENCOUNTER — Ambulatory Visit (HOSPITAL_COMMUNITY): Payer: Medicare HMO | Admitting: Vascular Surgery

## 2023-08-10 DIAGNOSIS — C50911 Malignant neoplasm of unspecified site of right female breast: Secondary | ICD-10-CM | POA: Diagnosis not present

## 2023-08-10 DIAGNOSIS — D0511 Intraductal carcinoma in situ of right breast: Secondary | ICD-10-CM | POA: Insufficient documentation

## 2023-08-10 DIAGNOSIS — Z170 Estrogen receptor positive status [ER+]: Secondary | ICD-10-CM | POA: Insufficient documentation

## 2023-08-10 DIAGNOSIS — E785 Hyperlipidemia, unspecified: Secondary | ICD-10-CM | POA: Insufficient documentation

## 2023-08-10 DIAGNOSIS — Z87891 Personal history of nicotine dependence: Secondary | ICD-10-CM | POA: Diagnosis not present

## 2023-08-10 DIAGNOSIS — C50511 Malignant neoplasm of lower-outer quadrant of right female breast: Secondary | ICD-10-CM | POA: Diagnosis not present

## 2023-08-10 HISTORY — PX: BREAST LUMPECTOMY WITH RADIOACTIVE SEED LOCALIZATION: SHX6424

## 2023-08-10 HISTORY — DX: Gastro-esophageal reflux disease without esophagitis: K21.9

## 2023-08-10 SURGERY — BREAST LUMPECTOMY WITH RADIOACTIVE SEED LOCALIZATION
Anesthesia: General | Laterality: Right

## 2023-08-10 MED ORDER — CHLORHEXIDINE GLUCONATE 0.12 % MT SOLN
15.0000 mL | Freq: Once | OROMUCOSAL | Status: AC
  Administered 2023-08-10: 15 mL via OROMUCOSAL
  Filled 2023-08-10: qty 15

## 2023-08-10 MED ORDER — ORAL CARE MOUTH RINSE: 15.0000 mL | Freq: Once | OROMUCOSAL | Status: AC

## 2023-08-10 MED ORDER — PROPOFOL 10 MG/ML IV BOLUS
INTRAVENOUS | Status: AC
  Filled 2023-08-10: qty 20

## 2023-08-10 MED ORDER — AMISULPRIDE (ANTIEMETIC) 5 MG/2ML IV SOLN: 10.0000 mg | Freq: Once | INTRAVENOUS | Status: DC | PRN

## 2023-08-10 MED ORDER — BUPIVACAINE-EPINEPHRINE (PF) 0.25% -1:200000 IJ SOLN
INTRAMUSCULAR | Status: AC
  Filled 2023-08-10: qty 30

## 2023-08-10 MED ORDER — CHLORHEXIDINE GLUCONATE CLOTH 2 % EX PADS: 6.0000 | MEDICATED_PAD | Freq: Once | CUTANEOUS | Status: DC

## 2023-08-10 MED ORDER — SODIUM CHLORIDE 0.9% FLUSH: 3.0000 mL | Freq: Two times a day (BID) | INTRAVENOUS | Status: DC

## 2023-08-10 MED ORDER — LACTATED RINGERS IV SOLN: INTRAVENOUS | Status: DC

## 2023-08-10 MED ORDER — FENTANYL CITRATE (PF) 250 MCG/5ML IJ SOLN
INTRAMUSCULAR | Status: DC | PRN
  Administered 2023-08-10 (×2): 50 ug via INTRAVENOUS

## 2023-08-10 MED ORDER — MIDAZOLAM HCL 2 MG/2ML IJ SOLN
INTRAMUSCULAR | Status: AC
  Filled 2023-08-10: qty 2

## 2023-08-10 MED ORDER — PROPOFOL 10 MG/ML IV BOLUS
INTRAVENOUS | Status: DC | PRN
  Administered 2023-08-10: 50 mg via INTRAVENOUS
  Administered 2023-08-10: 150 mg via INTRAVENOUS

## 2023-08-10 MED ORDER — FENTANYL CITRATE (PF) 100 MCG/2ML IJ SOLN: 25.0000 ug | INTRAMUSCULAR | Status: DC | PRN

## 2023-08-10 MED ORDER — LIDOCAINE 2% (20 MG/ML) 5 ML SYRINGE
INTRAMUSCULAR | Status: DC | PRN
  Administered 2023-08-10: 60 mg via INTRAVENOUS

## 2023-08-10 MED ORDER — ACETAMINOPHEN 500 MG PO TABS
1000.0000 mg | ORAL_TABLET | ORAL | Status: AC
  Administered 2023-08-10: 1000 mg via ORAL
  Filled 2023-08-10: qty 2

## 2023-08-10 MED ORDER — MIDAZOLAM HCL 2 MG/2ML IJ SOLN
INTRAMUSCULAR | Status: DC | PRN
  Administered 2023-08-10: 2 mg via INTRAVENOUS

## 2023-08-10 MED ORDER — CEFAZOLIN SODIUM-DEXTROSE 2-4 GM/100ML-% IV SOLN
2.0000 g | INTRAVENOUS | Status: AC
  Administered 2023-08-10: 2 g via INTRAVENOUS
  Filled 2023-08-10: qty 100

## 2023-08-10 MED ORDER — ONDANSETRON HCL 4 MG/2ML IJ SOLN
INTRAMUSCULAR | Status: DC | PRN
  Administered 2023-08-10: 4 mg via INTRAVENOUS

## 2023-08-10 MED ORDER — ACETAMINOPHEN 325 MG PO TABS: 650.0000 mg | ORAL_TABLET | ORAL | Status: DC | PRN

## 2023-08-10 MED ORDER — ROCURONIUM BROMIDE 10 MG/ML (PF) SYRINGE
PREFILLED_SYRINGE | INTRAVENOUS | Status: AC
  Filled 2023-08-10: qty 10

## 2023-08-10 MED ORDER — ACETAMINOPHEN 650 MG RE SUPP: 650.0000 mg | RECTAL | Status: DC | PRN

## 2023-08-10 MED ORDER — OXYCODONE HCL 5 MG PO TABS
5.0000 mg | ORAL_TABLET | ORAL | Status: DC | PRN
  Administered 2023-08-10: 5 mg via ORAL

## 2023-08-10 MED ORDER — PHENYLEPHRINE 80 MCG/ML (10ML) SYRINGE FOR IV PUSH (FOR BLOOD PRESSURE SUPPORT)
PREFILLED_SYRINGE | INTRAVENOUS | Status: DC | PRN
  Administered 2023-08-10: 80 ug via INTRAVENOUS

## 2023-08-10 MED ORDER — KETOROLAC TROMETHAMINE 15 MG/ML IJ SOLN
15.0000 mg | INTRAMUSCULAR | Status: DC
  Filled 2023-08-10: qty 1

## 2023-08-10 MED ORDER — SODIUM CHLORIDE 0.9 % IV SOLN: 250.0000 mL | INTRAVENOUS | Status: DC | PRN

## 2023-08-10 MED ORDER — DEXAMETHASONE SODIUM PHOSPHATE 10 MG/ML IJ SOLN
INTRAMUSCULAR | Status: DC | PRN
  Administered 2023-08-10: 10 mg via INTRAVENOUS

## 2023-08-10 MED ORDER — OXYCODONE HCL 5 MG PO TABS
ORAL_TABLET | ORAL | Status: AC
  Filled 2023-08-10: qty 1

## 2023-08-10 MED ORDER — OXYCODONE HCL 5 MG/5ML PO SOLN: 5.0000 mg | Freq: Once | ORAL | Status: DC | PRN

## 2023-08-10 MED ORDER — SODIUM CHLORIDE 0.9% FLUSH: 3.0000 mL | INTRAVENOUS | Status: DC | PRN

## 2023-08-10 MED ORDER — ENSURE PRE-SURGERY PO LIQD: 296.0000 mL | Freq: Once | ORAL | Status: DC

## 2023-08-10 MED ORDER — BUPIVACAINE-EPINEPHRINE 0.25% -1:200000 IJ SOLN
INTRAMUSCULAR | Status: DC | PRN
  Administered 2023-08-10: 10 mL

## 2023-08-10 MED ORDER — LIDOCAINE 2% (20 MG/ML) 5 ML SYRINGE
INTRAMUSCULAR | Status: AC
  Filled 2023-08-10: qty 5

## 2023-08-10 MED ORDER — OXYCODONE HCL 5 MG PO TABS: 5.0000 mg | ORAL_TABLET | Freq: Once | ORAL | Status: DC | PRN

## 2023-08-10 MED ORDER — FENTANYL CITRATE (PF) 250 MCG/5ML IJ SOLN
INTRAMUSCULAR | Status: AC
  Filled 2023-08-10: qty 5

## 2023-08-10 SURGICAL SUPPLY — 43 items
ADH SKN CLS APL DERMABOND .7 (GAUZE/BANDAGES/DRESSINGS) ×1
APL PRP STRL LF DISP 70% ISPRP (MISCELLANEOUS) ×1
APPLIER CLIP 9.375 MED OPEN (MISCELLANEOUS)
APR CLP MED 9.3 20 MLT OPN (MISCELLANEOUS)
BAG COUNTER SPONGE SURGICOUNT (BAG) ×1 IMPLANT
BAG SPNG CNTER NS LX DISP (BAG) ×1
BINDER BREAST LRG (GAUZE/BANDAGES/DRESSINGS) IMPLANT
BINDER BREAST XLRG (GAUZE/BANDAGES/DRESSINGS) IMPLANT
CANISTER SUCT 3000ML PPV (MISCELLANEOUS) ×1 IMPLANT
CHLORAPREP W/TINT 26 (MISCELLANEOUS) ×1 IMPLANT
CLIP APPLIE 9.375 MED OPEN (MISCELLANEOUS) IMPLANT
CLIP TI MEDIUM 6 (CLIP) ×1 IMPLANT
COVER PROBE W GEL 5X96 (DRAPES) ×1 IMPLANT
COVER SURGICAL LIGHT HANDLE (MISCELLANEOUS) ×1 IMPLANT
DERMABOND ADVANCED .7 DNX12 (GAUZE/BANDAGES/DRESSINGS) ×1 IMPLANT
DEVICE DUBIN SPECIMEN MAMMOGRA (MISCELLANEOUS) ×1 IMPLANT
DRAPE CHEST BREAST 15X10 FENES (DRAPES) ×1 IMPLANT
ELECT COATED BLADE 2.86 ST (ELECTRODE) ×1 IMPLANT
ELECT REM PT RETURN 9FT ADLT (ELECTROSURGICAL) ×1
ELECTRODE REM PT RTRN 9FT ADLT (ELECTROSURGICAL) ×1 IMPLANT
GLOVE BIO SURGEON STRL SZ7 (GLOVE) ×2 IMPLANT
GLOVE BIOGEL PI IND STRL 7.5 (GLOVE) ×1 IMPLANT
GOWN STRL REUS W/ TWL LRG LVL3 (GOWN DISPOSABLE) ×2 IMPLANT
GOWN STRL REUS W/TWL LRG LVL3 (GOWN DISPOSABLE) ×2
KIT BASIN OR (CUSTOM PROCEDURE TRAY) ×1 IMPLANT
KIT MARKER MARGIN INK (KITS) ×1 IMPLANT
LIGHT WAVEGUIDE WIDE FLAT (MISCELLANEOUS) IMPLANT
NDL HYPO 25GX1X1/2 BEV (NEEDLE) ×1 IMPLANT
NEEDLE HYPO 25GX1X1/2 BEV (NEEDLE) ×1
NS IRRIG 1000ML POUR BTL (IV SOLUTION) ×1 IMPLANT
PACK GENERAL/GYN (CUSTOM PROCEDURE TRAY) ×1 IMPLANT
STRIP CLOSURE SKIN 1/2X4 (GAUZE/BANDAGES/DRESSINGS) ×1 IMPLANT
SUT MNCRL AB 4-0 PS2 18 (SUTURE) ×1 IMPLANT
SUT MON AB 5-0 PS2 18 (SUTURE) IMPLANT
SUT SILK 2 0 SH (SUTURE) IMPLANT
SUT VIC AB 2-0 SH 27 (SUTURE) ×1
SUT VIC AB 2-0 SH 27XBRD (SUTURE) ×1 IMPLANT
SUT VIC AB 3-0 SH 27 (SUTURE) ×1
SUT VIC AB 3-0 SH 27X BRD (SUTURE) ×1 IMPLANT
SYR CONTROL 10ML LL (SYRINGE) ×1 IMPLANT
TAPE STRIPS DRAPE STRL (GAUZE/BANDAGES/DRESSINGS) IMPLANT
TOWEL GREEN STERILE (TOWEL DISPOSABLE) ×1 IMPLANT
TOWEL GREEN STERILE FF (TOWEL DISPOSABLE) ×1 IMPLANT

## 2023-08-10 NOTE — Anesthesia Postprocedure Evaluation (Signed)
Anesthesia Post Note  Patient: Angelica Hartman  Procedure(s) Performed: RIGHT BREAST SEED GUIDED LUMPECTOMY (Right)     Patient location during evaluation: PACU Anesthesia Type: General Level of consciousness: awake Pain management: pain level controlled Vital Signs Assessment: post-procedure vital signs reviewed and stable Respiratory status: spontaneous breathing, nonlabored ventilation and respiratory function stable Cardiovascular status: blood pressure returned to baseline and stable Postop Assessment: no apparent nausea or vomiting Anesthetic complications: no   No notable events documented.  Last Vitals:  Vitals:   08/10/23 0830 08/10/23 0845  BP: 101/67 115/79  Pulse: 69 63  Resp: 10 10  Temp:  (!) 36.2 C  SpO2: 99% 99%    Last Pain:  Vitals:   08/10/23 0806  TempSrc:   PainSc: Asleep                 Linton Rump

## 2023-08-10 NOTE — Transfer of Care (Signed)
Immediate Anesthesia Transfer of Care Note  Patient: Angelica Hartman  Procedure(s) Performed: RIGHT BREAST SEED GUIDED LUMPECTOMY (Right)  Patient Location: PACU  Anesthesia Type:General  Level of Consciousness: drowsy and patient cooperative  Airway & Oxygen Therapy: Patient Spontanous Breathing and Patient connected to face mask oxygen  Post-op Assessment: Report given to RN, Post -op Vital signs reviewed and stable, and Patient moving all extremities  Post vital signs: Reviewed and stable  Last Vitals:  Vitals Value Taken Time  BP 100/62 08/10/23 0806  Temp    Pulse 70 08/10/23 0808  Resp 10 08/10/23 0808  SpO2 96 % 08/10/23 0808  Vitals shown include unfiled device data.  Last Pain:  Vitals:   08/10/23 0603  TempSrc: Oral  PainSc:       Patients Stated Pain Goal: 0 (08/10/23 0601)  Complications: No notable events documented.

## 2023-08-10 NOTE — Anesthesia Procedure Notes (Signed)
Procedure Name: LMA Insertion Date/Time: 08/10/2023 7:23 AM  Performed by: Colon Flattery, CRNAPre-anesthesia Checklist: Patient identified, Emergency Drugs available, Suction available and Patient being monitored Patient Re-evaluated:Patient Re-evaluated prior to induction Oxygen Delivery Method: Circle system utilized Preoxygenation: Pre-oxygenation with 100% oxygen Induction Type: IV induction LMA: LMA inserted LMA Size: 4.0 Placement Confirmation: positive ETCO2 and breath sounds checked- equal and bilateral Dental Injury: Teeth and Oropharynx as per pre-operative assessment

## 2023-08-10 NOTE — Op Note (Signed)
Preoperative diagnosis: Clinical stage I right breast cancer Postoperative diagnosis: Same as above Procedure: Right breast radioactive seed guided lumpectomy Surgeon: Dr. Harden Mo Anesthesia: General Estimated blood loss: Minimal Complications: None Drains: Specimens: 1.  Right breast tissue containing seed and clip marked with paint 2.  Additional anterior margin marked short superior, long lateral, double deep Special count was correct at completion Disposition recovery stable addition  Indications:67 yof who has mass right lower outer quadrant since January. She has no nipple dc.There is mass in central breast. No mm correlate. On [place] there is a 7x5x6 mm mass. Axillary [place] is negative. 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  We discussed a lumpectomy.  Procedure: After informed consent was obtained she was taken to the operating room.  She was given Ancef which she tolerated just fine.  SCDs were in place.  She was placed under general anesthesia without complication.  She was prepped and draped in the standard sterile surgical fashion.  Surgical timeout was then performed.  I located the seed in the lower outer quadrant.  I then made a periareolar incision in order to hide the scar later.  I infiltrated Marcaine throughout this area.  I then dissected to the seed using the neoprobe for guidance.  I remove the seed in the surrounding tissue with an attempt to get a clear margin.  Mammogram confirmed removal of the seed and the clip.  I thought all my margins were clear but I might be close to my anterior so I remove this.  The anterior margin is now the skin.  Hemostasis was observed.  I then placed a couple clips in the cavity.  I then closed the cavity with 2-0 Vicryl and the skin was closed with 3-0 Vicryl and 5-0 Monocryl.  Glue and Steri-Strips were applied.  She tolerated this well was extubated and transferred recovery stable.

## 2023-08-10 NOTE — Discharge Instructions (Signed)
[facility] Office Phone Number [phone]  POST OP INSTRUCTIONS Take 400 mg of ibuprofen every 8 hours or 650 mg tylenol every 6 hours for next 72 hours then as needed. Use ice several times daily also.  A prescription for pain medication may be given to you upon discharge.  Take your pain medication as prescribed, if needed.  If narcotic pain medicine is not needed, then you may take acetaminophen (Tylenol), naprosyn (Alleve) or ibuprofen (Advil) as needed. Take your usually prescribed medications unless otherwise directed If you need a refill on your pain medication, please contact your pharmacy.  They will contact our office to request authorization.  Prescriptions will not be filled after 5pm or on week-ends. You should eat very light the first 24 hours after surgery, such as soup, crackers, pudding, etc.  Resume your normal diet the day after surgery. Most patients will experience some swelling and bruising in the breast.  Ice packs and a good support bra will help.  Wear the breast binder provided or a sports bra for 72 hours day and night.  After that wear a sports bra during the day until you return to the office. Swelling and bruising can take several days to resolve.  It is common to experience some constipation if taking pain medication after surgery.  Increasing fluid intake and taking a stool softener will usually help or prevent this problem from occurring.  A mild laxative (Milk of Magnesia or Miralax) should be taken according to package directions if there are no bowel movements after 48 hours. I used skin glue on the incision, you may shower in 24 hours.  The glue will flake off over the next 2-3 weeks.  Any sutures or staples will be removed at the office during your follow-up visit. ACTIVITIES:  You may resume regular daily activities (gradually increasing) beginning the next day.  Wearing a good support bra or sports bra minimizes pain and swelling.  You may have  sexual intercourse when it is comfortable. You may drive when you no longer are taking prescription pain medication, you can comfortably wear a seatbelt, and you can safely maneuver your car and apply brakes. RETURN TO WORK:  ______________________________________________________________________________________ You should see your doctor in the office for a follow-up appointment approximately two weeks after your surgery.  Your doctor's nurse will typically make your follow-up appointment when she calls you with your pathology report.  Expect your pathology report 3-4 business days after your surgery.  You may call to check if you do not hear from us after three days. OTHER INSTRUCTIONS: _______________________________________________________________________________________________ _____________________________________________________________________________________________________________________________________ _____________________________________________________________________________________________________________________________________ _____________________________________________________________________________________________________________________________________  WHEN TO CALL DR WAKEFIELD: Fever over 101.0 Nausea and/or vomiting. Extreme swelling or bruising. Continued bleeding from incision. Increased pain, redness, or drainage from the incision.  The clinic staff is available to answer your questions during regular business hours.  Please don't hesitate to call and ask to speak to one of the nurses for clinical concerns.  If you have a medical emergency, go to the nearest emergency room or call 911.  A surgeon from [facility] is always on call at the hospital.  For further questions, please visit [email or web address]  

## 2023-08-10 NOTE — Interval H&P Note (Signed)
History and Physical Interval Note:  08/10/2023 6:51 AM  Angelica Hartman  has presented today for surgery, with the diagnosis of RIGHT BREAST CANCER.  The various methods of treatment have been discussed with the patient and family. After consideration of risks, benefits and other options for treatment, the patient has consented to  Procedure(s): RIGHT BREAST SEED GUIDED LUMPECTOMY (Right) as a surgical intervention.  The patient's history has been reviewed, patient examined, no change in status, stable for surgery.  I have reviewed the patient's chart and labs.  Questions were answered to the patient's satisfaction.     Emelia Loron

## 2023-08-11 ENCOUNTER — Encounter (HOSPITAL_COMMUNITY): Payer: Self-pay | Admitting: General Surgery

## 2023-08-14 LAB — SURGICAL PATHOLOGY

## 2023-08-17 ENCOUNTER — Encounter: Payer: Self-pay | Admitting: *Deleted

## 2023-08-17 ENCOUNTER — Telehealth: Payer: Self-pay | Admitting: *Deleted

## 2023-08-17 NOTE — Telephone Encounter (Signed)
Spoke to pt regarding final pathology and oncotype testing ordering. Pt also concerned as not received post op appt with Dr. Pamelia Hoit. Scheduled and confirmed appt with Dr. Pamelia Hoit for 08/21/23 at 11am to discuss final pathology and further treatment. Denies further questions or needs at this time.

## 2023-08-21 ENCOUNTER — Inpatient Hospital Stay: Payer: Medicare HMO | Attending: Hematology and Oncology | Admitting: Hematology and Oncology

## 2023-08-21 VITALS — BP 101/60 | HR 74 | Temp 97.9°F | Resp 18 | Ht 61.0 in | Wt 150.1 lb

## 2023-08-21 DIAGNOSIS — C50511 Malignant neoplasm of lower-outer quadrant of right female breast: Secondary | ICD-10-CM | POA: Insufficient documentation

## 2023-08-21 DIAGNOSIS — Z79899 Other long term (current) drug therapy: Secondary | ICD-10-CM | POA: Diagnosis not present

## 2023-08-21 DIAGNOSIS — Z170 Estrogen receptor positive status [ER+]: Secondary | ICD-10-CM | POA: Insufficient documentation

## 2023-08-21 NOTE — Progress Notes (Signed)
Patient Care Team: Milus Height, [place] as PCP - General (Physician Assistant) Donnelly Angelica, RN as Oncology Nurse Navigator Pershing Proud, RN as Oncology Nurse Navigator Serena Croissant, MD as Consulting Physician (Hematology and Oncology) Antony Blackbird, MD as Consulting Physician (Radiation Oncology) Emelia Loron, MD as Consulting Physician (General Surgery)  DIAGNOSIS:  Encounter Diagnosis  Name Primary?   Malignant neoplasm of lower-outer quadrant of right breast of female, estrogen receptor positive (HCC) Yes    SUMMARY OF ONCOLOGIC HISTORY: Oncology History  Malignant neoplasm of lower-outer quadrant of right breast of female, estrogen receptor positive (HCC)  07/25/2023 Initial Diagnosis   Palpable right breast lump by ultrasound measured 0.7 cm at 7 o'clock position, biopsy: Grade 2 IDC ER 90%, PR 60%, Ki67 5%, FISH negative for HER2   08/10/2023 Surgery   Right lumpectomy: Grade 2 IDC 1 cm, margins negative, no evidence of lymphovascular invasion, ER 90%, PR 60%, HER2 negative, Ki-67 5%     CHIEF COMPLIANT:   Discussed the use of AI scribe software for clinical note transcription with the patient, who gave verbal consent to proceed.  History of Present Illness   The patient, with a history of recently diagnosed breast cancer, presents for a post-operative follow-up. She reports good healing and no major surprises since the surgery. The size of the cancer was 1.7 cm, with negative margins, indicating no need for further surgical intervention. The patient is awaiting results from an Oncotype test, which will provide further information on the likelihood of recurrence and the need for chemotherapy. She expresses a strong preference against chemotherapy. The patient also mentions a future appointment with Dr. Dwain Sarna for a check on the surgical site.         ALLERGIES:  is allergic to latex, pseudoephedrine hcl, wound dressing adhesive, codeine, and  penicillins.  MEDICATIONS:  Current Outpatient Medications  Medication Sig Dispense Refill   atorvastatin (LIPITOR) 10 MG tablet Take 10 mg by mouth daily.     cetirizine (ZYRTEC) 10 MG tablet Take 1 tablet (10 mg total) by mouth daily. 30 tablet 0   Collagen Hydrolysate POWD by Does not apply route. 2 tablespoons a day     ibuprofen (ADVIL) 200 MG tablet Take 400 mg by mouth every 6 (six) hours as needed for mild pain, headache or moderate pain.     LORazepam (ATIVAN) 0.5 MG tablet Take 0.5 mg by mouth 2 (two) times daily as needed for anxiety.      MAGNESIUM GLUCONATE PO Take 1,400 mg by mouth at bedtime. 350 mg each     Omeprazole 20 MG TBDD Take 20 mg by mouth daily.     Probiotic Product (PROBIOTIC PO) Take 1 tablet by mouth daily. 25 billion cfu     sertraline (ZOLOFT) 25 MG tablet Take 1 tablet (25 mg total) by mouth daily. Increase to 2 tablets and 7 days. (Patient taking differently: Take 25 mg by mouth daily.) 60 tablet 1   vitamin C (ASCORBIC ACID) 250 MG tablet Take 500 mg by mouth daily.     No current facility-administered medications for this visit.    PHYSICAL EXAMINATION: ECOG PERFORMANCE STATUS: 1 - Symptomatic but completely ambulatory  Vitals:   08/21/23 1120  BP: 101/60  Pulse: 74  Resp: 18  Temp: 97.9 F (36.6 C)  SpO2: 100%   Filed Weights   08/21/23 1120  Weight: 150 lb 1.6 oz (68.1 kg)    Physical Exam          (  exam performed in the presence of a chaperone)  LABORATORY DATA:  I have reviewed the data as listed    Latest Ref Rng & Units 08/02/2023   12:04 PM 08/19/2019    7:06 PM 08/19/2019    6:58 PM  CMP  Glucose 70 - 99 mg/dL 93  86  92   BUN 8 - 23 mg/dL 20  15  15    Creatinine 0.44 - 1.00 mg/dL 1.61  0.96  0.45   Sodium 135 - 145 mmol/L 140  140  139   Potassium 3.5 - 5.1 mmol/L 4.2  3.5  3.6   Chloride 98 - 111 mmol/L 105  104  105   CO2 22 - 32 mmol/L 29   23   Calcium 8.9 - 10.3 mg/dL 9.2   9.2   Total Protein 6.5 - 8.1 g/dL 6.8    7.7   Total Bilirubin 0.3 - 1.2 mg/dL 0.3   0.3   Alkaline Phos 38 - 126 U/L 75   46   AST 15 - 41 U/L 18   22   ALT 0 - 44 U/L 14   18     Lab Results  Component Value Date   WBC 6.8 08/02/2023   HGB 13.6 08/02/2023   HCT 38.4 08/02/2023   MCV 90.8 08/02/2023   PLT 235 08/02/2023   NEUTROABS 3.4 08/02/2023    ASSESSMENT & PLAN:  Malignant neoplasm of lower-outer quadrant of right breast of female, estrogen receptor positive (HCC) 08/10/2023:Right lumpectomy: Grade 2 IDC 1 cm, margins negative, no evidence of lymphovascular invasion, ER 90%, PR 60%, HER2 negative, Ki-67 5%  Pathology counseling: I discussed the final pathology report of the patient provided  a copy of this report. I discussed the margins as well as lymph node surgeries. We also discussed the final staging along with previously performed ER/PR and HER-2/neu testing.  Treatment plan: Oncotype DX testing to determine if she would benefit from chemotherapy Adjuvant radiation therapy Adjuvant antiestrogen therapy  Return to clinic based upon Oncotype DX test result ------------------------------------- Assessment and Plan    Breast Cancer (Stage 1a) Post-surgical status with clear margins and no lymphovascular invasion. Estrogen and progesterone positive, HER2 negative. Low growth rate (5%). Oncotype test results pending. - Await Oncotype test results (expected around 08/31/2023) to confirm low likelihood of recurrence and no need for chemotherapy. - Plan for radiation therapy after Oncotype results are received.  Post-Surgical Follow-up Healing well post-surgery with no major complications. - Follow-up with Dr. Dwain Sarna on 09/06/2023 to assess surgical site. - Follow-up with current provider at the end of radiation therapy to initiate hormone therapy.          No orders of the defined types were placed in this encounter.  The patient has a good understanding of the overall plan. she agrees with it. she will  call with any problems that may develop before the next visit here. Total time spent: 30 mins including face to face time and time spent for planning, charting and co-ordination of care   Tamsen Meek, MD 08/21/23

## 2023-08-21 NOTE — Assessment & Plan Note (Signed)
08/10/2023:Right lumpectomy: Grade 2 IDC 1 cm, margins negative, no evidence of lymphovascular invasion, ER 90%, PR 60%, HER2 negative, Ki-67 5%  Pathology counseling: I discussed the final pathology report of the patient provided  a copy of this report. I discussed the margins as well as lymph node surgeries. We also discussed the final staging along with previously performed ER/PR and HER-2/neu testing.  Treatment plan: Oncotype DX testing to determine if she would benefit from chemotherapy Adjuvant radiation therapy Adjuvant antiestrogen therapy  Return to clinic based upon Oncotype DX test result

## 2023-08-30 DIAGNOSIS — C50511 Malignant neoplasm of lower-outer quadrant of right female breast: Secondary | ICD-10-CM | POA: Diagnosis not present

## 2023-08-30 DIAGNOSIS — Z170 Estrogen receptor positive status [ER+]: Secondary | ICD-10-CM | POA: Diagnosis not present

## 2023-09-01 ENCOUNTER — Telehealth: Payer: Self-pay | Admitting: *Deleted

## 2023-09-01 ENCOUNTER — Encounter: Payer: Self-pay | Admitting: *Deleted

## 2023-09-01 DIAGNOSIS — Z170 Estrogen receptor positive status [ER+]: Secondary | ICD-10-CM

## 2023-09-01 DIAGNOSIS — H2513 Age-related nuclear cataract, bilateral: Secondary | ICD-10-CM | POA: Diagnosis not present

## 2023-09-01 DIAGNOSIS — H52203 Unspecified astigmatism, bilateral: Secondary | ICD-10-CM | POA: Diagnosis not present

## 2023-09-01 NOTE — Telephone Encounter (Signed)
Received oncotype score of 22. Physician team notified. Pt made aware of results and next steps will be xrt with Dr. Roselind Messier.  Received understanding. Referral to Dr. Roselind Messier placed.

## 2023-09-04 NOTE — Progress Notes (Addendum)
Location of Breast Cancer:  Malignant neoplasm of lower-outer quadrant of right breast of female, estrogen receptor positive  Histology per Pathology Report:  08/10/2023 FINAL MICROSCOPIC DIAGNOSIS:  A. BREAST, RIGHT, LUMPECTOMY:  - Invasive ductal carcinoma, grade 2, 1 cm  - Resection margins are negative for carcinoma; closest is the posterior margin at 0.4 cm  - No evidence of lymphovascular invasion  - Biopsy site changes  - See oncology table  B. BREAST, RIGHT ADDITIONAL ANTERIOR MARGIN, EXCISION:  - Benign fibroadipose tissue, negative for carcinoma   Receptor Status: ER(90%), PR (60%), Her2-neu (Negative via FISH), Ki-67(5%)  Did patient present with symptoms (if so, please note symptoms) or was this found on screening mammography?: "presented with a small palpable right lower outer breast mass which has been present for 1 year. She subsequently underwent bilateral diagnostic mammography with tomography and right breast ultrasonography at [facility] on 07/21/23"  Past/Anticipated interventions by surgeon, if any:  09/06/2023 --Dr. Emelia Loron (post-op visit)   08/10/2023 --Dr. Emelia Loron Right breast radioactive seed guided lumpectomy   Past/Anticipated interventions by medical oncology, if any:  Under care of Dr. Serena Croissant 08/21/2023 --Treatment plan: Oncotype DX testing to determine if she would benefit from chemotherapy Adjuvant radiation therapy Adjuvant antiestrogen therapy --Return to clinic based upon Oncotype DX test result  09/01/23 note from [facility]: "Received oncotype score of 22. Physician team notified. Pt made aware of results and next steps will be xrt with Dr. Roselind Messier"  Lymphedema issues, if any:  No  Pain issues, if any:  No  SAFETY ISSUES: Prior radiation? No Pacemaker/ICD? No Possible current pregnancy? No--postmenopausal Is the patient on methotrexate? No  Current Complaints / other details: She wants to know if it is  okay to get a deep tissue massage and/or acupuncture?     BP 99/60 (BP Location: Left Arm, Patient Position: Sitting)   Pulse 85   Temp (!) 97.3 F (36.3 C)   Resp 18   Ht 5\' 1"  (1.549 m)   Wt 150 lb 8 oz (68.3 kg)   SpO2 98%   BMI 28.44 kg/m

## 2023-09-05 ENCOUNTER — Encounter (HOSPITAL_COMMUNITY): Payer: Self-pay

## 2023-09-05 DIAGNOSIS — R69 Illness, unspecified: Secondary | ICD-10-CM | POA: Diagnosis not present

## 2023-09-06 NOTE — Progress Notes (Signed)
Radiation Oncology         (336) [phone] ________________________________  Name: SINA HOSEA MRN: [id number]  Date: 09/07/2023  DOB: 12-Jan-1956  Re-Evaluation Note  CC: Milus Height, PA  Serena Croissant, MD  No diagnosis found.  Diagnosis:   Stage IA (cT1b, cN0, cM0) Right Breast LOQ, Invasive ductal (mammary) carcinoma, ER+ / PR+ / Her2-, Grade 2: s/p lumpectomy without SLN biopsies  Narrative:  The patient returns today to discuss radiation treatment options. She was seen in the multidisciplinary breast clinic on 08/02/23.   She opted to proceed with a right breast lumpectomy without nodal biopsies on 08/10/23 under the care of Dr. Dwain Sarna.  Pathology from the procedure revealed: tumor the size of 1 cm, histology of grade 2 invasive ductal carcinoma; all margins negative for invasive carcinoma; negative for LVI.  Prognostic indicators significant for: estrogen receptor 90% positive with strong staining intensity; progesterone receptor 60% positive with moderate-strong staining intensity; Proliferation marker Ki67 at 5%; Her2 status negative; Grade 2.   Oncotype DX was obtained on the final surgical sample and the recurrence score of 22 predicts a risk of recurrence outside the breast over the next 9 years of 8%, if the patient's only systemic therapy is an antiestrogen for 5 years.  It also predicts no significant benefit from chemotherapy.  Based on her negative Oncotype Dx and positive ER status, she will return to Dr. Pamelia Hoit in the near future to discuss antiestrogen treatment options.   On review of systems, the patient reports ***. She denies *** and any other symptoms.    Allergies:  is allergic to latex, pseudoephedrine hcl, wound dressing adhesive, codeine, and penicillins.  Meds: Current Outpatient Medications  Medication Sig Dispense Refill   atorvastatin (LIPITOR) 10 MG tablet Take 10 mg by mouth daily.     cetirizine (ZYRTEC) 10 MG tablet Take 1 tablet (10 mg total)  by mouth daily. 30 tablet 0   Collagen Hydrolysate POWD by Does not apply route. 2 tablespoons a day     ibuprofen (ADVIL) 200 MG tablet Take 400 mg by mouth every 6 (six) hours as needed for mild pain, headache or moderate pain.     LORazepam (ATIVAN) 0.5 MG tablet Take 0.5 mg by mouth 2 (two) times daily as needed for anxiety.      MAGNESIUM GLUCONATE PO Take 1,400 mg by mouth at bedtime. 350 mg each     Omeprazole 20 MG TBDD Take 20 mg by mouth daily.     Probiotic Product (PROBIOTIC PO) Take 1 tablet by mouth daily. 25 billion cfu     sertraline (ZOLOFT) 25 MG tablet Take 1 tablet (25 mg total) by mouth daily. Increase to 2 tablets and 7 days. (Patient taking differently: Take 25 mg by mouth daily.) 60 tablet 1   vitamin C (ASCORBIC ACID) 250 MG tablet Take 500 mg by mouth daily.     No current facility-administered medications for this encounter.    Physical Findings: The patient is in no acute distress. Patient is alert and oriented.  vitals were not taken for this visit.  No significant changes. Lungs are clear to auscultation bilaterally. Heart has regular rate and rhythm. No palpable cervical, supraclavicular, or axillary adenopathy. Abdomen soft, non-tender, normal bowel sounds. Left Breast: no palpable mass, nipple discharge or bleeding. Right Breast: ***  Lab Findings: Lab Results  Component Value Date   WBC 6.8 08/02/2023   HGB 13.6 08/02/2023   HCT 38.4 08/02/2023   MCV 90.8  08/02/2023   PLT 235 08/02/2023    Radiographic Findings: No results found.  Impression: Stage IA (cT1b, cN0, cM0) Right Breast LOQ, Invasive ductal (mammary) carcinoma, ER+ / PR+ / Her2-, Grade 2: s/p lumpectomy without SLN biopsies  ***  Plan:  Patient is scheduled for CT simulation {date/later today}. ***  -----------------------------------  Billie Lade, PhD, MD  This document serves as a record of services personally performed by Antony Blackbird, MD. It was created on his behalf by  Neena Rhymes, a trained medical scribe. The creation of this record is based on the scribe's personal observations and the provider's statements to them. This document has been checked and approved by the attending provider.

## 2023-09-07 ENCOUNTER — Ambulatory Visit
Admission: RE | Admit: 2023-09-07 | Discharge: 2023-09-07 | Disposition: A | Discharge status: Home / Self Care | Payer: Medicare HMO | Source: Ambulatory Visit | Attending: Radiation Oncology | Admitting: Radiation Oncology

## 2023-09-07 ENCOUNTER — Encounter: Payer: Self-pay | Admitting: *Deleted

## 2023-09-07 ENCOUNTER — Encounter: Payer: Self-pay | Admitting: Radiation Oncology

## 2023-09-07 VITALS — BP 99/60 | HR 85 | Temp 97.3°F | Resp 18 | Ht 61.0 in | Wt 150.5 lb

## 2023-09-07 DIAGNOSIS — C50511 Malignant neoplasm of lower-outer quadrant of right female breast: Secondary | ICD-10-CM

## 2023-09-07 DIAGNOSIS — Z170 Estrogen receptor positive status [ER+]: Secondary | ICD-10-CM | POA: Insufficient documentation

## 2023-09-07 DIAGNOSIS — Z510 Encounter for antineoplastic radiation therapy: Secondary | ICD-10-CM | POA: Diagnosis not present

## 2023-09-07 DIAGNOSIS — Z79899 Other long term (current) drug therapy: Secondary | ICD-10-CM | POA: Diagnosis not present

## 2023-09-12 DIAGNOSIS — Z13820 Encounter for screening for osteoporosis: Secondary | ICD-10-CM | POA: Diagnosis not present

## 2023-09-12 DIAGNOSIS — Z01419 Encounter for gynecological examination (general) (routine) without abnormal findings: Secondary | ICD-10-CM | POA: Diagnosis not present

## 2023-09-12 DIAGNOSIS — Z01411 Encounter for gynecological examination (general) (routine) with abnormal findings: Secondary | ICD-10-CM | POA: Diagnosis not present

## 2023-09-12 DIAGNOSIS — N951 Menopausal and female climacteric states: Secondary | ICD-10-CM | POA: Diagnosis not present

## 2023-09-12 DIAGNOSIS — Z6828 Body mass index (BMI) 28.0-28.9, adult: Secondary | ICD-10-CM | POA: Diagnosis not present

## 2023-09-12 DIAGNOSIS — Z124 Encounter for screening for malignant neoplasm of cervix: Secondary | ICD-10-CM | POA: Diagnosis not present

## 2023-09-12 DIAGNOSIS — Z170 Estrogen receptor positive status [ER+]: Secondary | ICD-10-CM | POA: Diagnosis not present

## 2023-09-13 ENCOUNTER — Encounter: Payer: Self-pay | Admitting: *Deleted

## 2023-09-13 DIAGNOSIS — Z170 Estrogen receptor positive status [ER+]: Secondary | ICD-10-CM

## 2023-09-18 DIAGNOSIS — D2372 Other benign neoplasm of skin of left lower limb, including hip: Secondary | ICD-10-CM | POA: Diagnosis not present

## 2023-09-18 DIAGNOSIS — L570 Actinic keratosis: Secondary | ICD-10-CM | POA: Diagnosis not present

## 2023-09-19 DIAGNOSIS — C50511 Malignant neoplasm of lower-outer quadrant of right female breast: Secondary | ICD-10-CM | POA: Diagnosis not present

## 2023-09-19 DIAGNOSIS — Z170 Estrogen receptor positive status [ER+]: Secondary | ICD-10-CM | POA: Diagnosis not present

## 2023-09-19 DIAGNOSIS — Z510 Encounter for antineoplastic radiation therapy: Secondary | ICD-10-CM | POA: Diagnosis not present

## 2023-09-21 ENCOUNTER — Other Ambulatory Visit: Payer: Self-pay

## 2023-09-21 ENCOUNTER — Ambulatory Visit
Admission: RE | Admit: 2023-09-21 | Discharge: 2023-09-21 | Disposition: A | Discharge status: Home / Self Care | Payer: Medicare HMO | Source: Ambulatory Visit | Attending: Radiation Oncology | Admitting: Radiation Oncology

## 2023-09-21 DIAGNOSIS — C50511 Malignant neoplasm of lower-outer quadrant of right female breast: Secondary | ICD-10-CM

## 2023-09-21 DIAGNOSIS — Z510 Encounter for antineoplastic radiation therapy: Secondary | ICD-10-CM | POA: Diagnosis not present

## 2023-09-21 DIAGNOSIS — Z170 Estrogen receptor positive status [ER+]: Secondary | ICD-10-CM | POA: Diagnosis not present

## 2023-09-21 LAB — RAD ONC ARIA SESSION SUMMARY
Course Elapsed Days: 0
Plan Fractions Treated to Date: 1
Plan Prescribed Dose Per Fraction: 2.67 Gy
Plan Total Fractions Prescribed: 15
Plan Total Prescribed Dose: 40.05 Gy
Reference Point Dosage Given to Date: 2.67 Gy
Reference Point Session Dosage Given: 2.67 Gy
Session Number: 1

## 2023-09-22 ENCOUNTER — Other Ambulatory Visit: Payer: Self-pay

## 2023-09-22 ENCOUNTER — Ambulatory Visit
Admission: RE | Admit: 2023-09-22 | Discharge: 2023-09-22 | Disposition: A | Discharge status: Home / Self Care | Payer: Medicare HMO | Source: Ambulatory Visit | Attending: Radiation Oncology | Admitting: Radiation Oncology

## 2023-09-22 DIAGNOSIS — Z170 Estrogen receptor positive status [ER+]: Secondary | ICD-10-CM | POA: Diagnosis not present

## 2023-09-22 DIAGNOSIS — Z510 Encounter for antineoplastic radiation therapy: Secondary | ICD-10-CM | POA: Diagnosis not present

## 2023-09-22 DIAGNOSIS — C50511 Malignant neoplasm of lower-outer quadrant of right female breast: Secondary | ICD-10-CM | POA: Diagnosis not present

## 2023-09-22 LAB — RAD ONC ARIA SESSION SUMMARY
Course Elapsed Days: 1
Plan Fractions Treated to Date: 2
Plan Prescribed Dose Per Fraction: 2.67 Gy
Plan Total Fractions Prescribed: 15
Plan Total Prescribed Dose: 40.05 Gy
Reference Point Dosage Given to Date: 5.34 Gy
Reference Point Session Dosage Given: 2.67 Gy
Session Number: 2

## 2023-09-25 ENCOUNTER — Ambulatory Visit
Admission: RE | Admit: 2023-09-25 | Discharge: 2023-09-25 | Disposition: A | Discharge status: Home / Self Care | Payer: Medicare HMO | Source: Ambulatory Visit | Attending: Radiation Oncology | Admitting: Radiation Oncology

## 2023-09-25 ENCOUNTER — Other Ambulatory Visit: Payer: Self-pay

## 2023-09-25 DIAGNOSIS — Z170 Estrogen receptor positive status [ER+]: Secondary | ICD-10-CM | POA: Diagnosis not present

## 2023-09-25 DIAGNOSIS — Z510 Encounter for antineoplastic radiation therapy: Secondary | ICD-10-CM | POA: Diagnosis not present

## 2023-09-25 DIAGNOSIS — C50511 Malignant neoplasm of lower-outer quadrant of right female breast: Secondary | ICD-10-CM | POA: Diagnosis not present

## 2023-09-25 LAB — RAD ONC ARIA SESSION SUMMARY
Course Elapsed Days: 4
Plan Fractions Treated to Date: 3
Plan Prescribed Dose Per Fraction: 2.67 Gy
Plan Total Fractions Prescribed: 15
Plan Total Prescribed Dose: 40.05 Gy
Reference Point Dosage Given to Date: 8.01 Gy
Reference Point Session Dosage Given: 2.67 Gy
Session Number: 3

## 2023-09-26 ENCOUNTER — Other Ambulatory Visit: Payer: Self-pay

## 2023-09-26 ENCOUNTER — Ambulatory Visit
Admission: RE | Admit: 2023-09-26 | Discharge: 2023-09-26 | Disposition: A | Discharge status: Home / Self Care | Payer: Medicare HMO | Source: Ambulatory Visit | Attending: Radiation Oncology | Admitting: Radiation Oncology

## 2023-09-26 DIAGNOSIS — C50511 Malignant neoplasm of lower-outer quadrant of right female breast: Secondary | ICD-10-CM | POA: Diagnosis not present

## 2023-09-26 DIAGNOSIS — Z510 Encounter for antineoplastic radiation therapy: Secondary | ICD-10-CM | POA: Diagnosis not present

## 2023-09-26 DIAGNOSIS — Z170 Estrogen receptor positive status [ER+]: Secondary | ICD-10-CM | POA: Diagnosis not present

## 2023-09-26 LAB — RAD ONC ARIA SESSION SUMMARY
Course Elapsed Days: 5
Plan Fractions Treated to Date: 4
Plan Prescribed Dose Per Fraction: 2.67 Gy
Plan Total Fractions Prescribed: 15
Plan Total Prescribed Dose: 40.05 Gy
Reference Point Dosage Given to Date: 10.68 Gy
Reference Point Session Dosage Given: 2.67 Gy
Session Number: 4

## 2023-09-26 MED ORDER — RADIAPLEXRX EX GEL: Freq: Once | CUTANEOUS | Status: AC

## 2023-09-26 MED ORDER — ALRA NON-METALLIC DEODORANT (RAD-ONC)
1.0000 | Freq: Once | TOPICAL | Status: AC
  Administered 2023-09-26: 1 via TOPICAL

## 2023-09-26 NOTE — Progress Notes (Signed)
Pt here for patient teaching. Pt given Radiation and You booklet, skin care instructions, Alra deodorant, and Radiaplex gel. Reviewed areas of pertinence such as fatigue, hair loss, skin changes, breast tenderness, and breast swelling . Pt able to give teach back of to pat skin, use unscented/gentle soap, and drink plenty of water,apply Radiaplex bid, avoid applying anything to skin within 4 hours of treatment, avoid wearing an under wire bra, and to use an electric razor if they must shave. Pt verbalizes understanding of information given and will contact nursing with any questions or concerns.           

## 2023-09-27 ENCOUNTER — Ambulatory Visit
Admission: RE | Admit: 2023-09-27 | Discharge: 2023-09-27 | Disposition: A | Discharge status: Home / Self Care | Payer: Medicare HMO | Source: Ambulatory Visit | Attending: Radiation Oncology | Admitting: Radiation Oncology

## 2023-09-27 ENCOUNTER — Other Ambulatory Visit: Payer: Self-pay

## 2023-09-27 DIAGNOSIS — C50511 Malignant neoplasm of lower-outer quadrant of right female breast: Secondary | ICD-10-CM | POA: Diagnosis not present

## 2023-09-27 DIAGNOSIS — Z170 Estrogen receptor positive status [ER+]: Secondary | ICD-10-CM | POA: Diagnosis not present

## 2023-09-27 DIAGNOSIS — Z510 Encounter for antineoplastic radiation therapy: Secondary | ICD-10-CM | POA: Diagnosis not present

## 2023-09-27 LAB — RAD ONC ARIA SESSION SUMMARY
Course Elapsed Days: 6
Plan Fractions Treated to Date: 5
Plan Prescribed Dose Per Fraction: 2.67 Gy
Plan Total Fractions Prescribed: 15
Plan Total Prescribed Dose: 40.05 Gy
Reference Point Dosage Given to Date: 13.35 Gy
Reference Point Session Dosage Given: 2.67 Gy
Session Number: 5

## 2023-09-28 ENCOUNTER — Other Ambulatory Visit: Payer: Self-pay

## 2023-09-28 ENCOUNTER — Ambulatory Visit
Admission: RE | Admit: 2023-09-28 | Discharge: 2023-09-28 | Disposition: A | Discharge status: Home / Self Care | Payer: Medicare HMO | Source: Ambulatory Visit | Attending: Radiation Oncology | Admitting: Radiation Oncology

## 2023-09-28 DIAGNOSIS — Z510 Encounter for antineoplastic radiation therapy: Secondary | ICD-10-CM | POA: Diagnosis not present

## 2023-09-28 DIAGNOSIS — C50511 Malignant neoplasm of lower-outer quadrant of right female breast: Secondary | ICD-10-CM | POA: Diagnosis not present

## 2023-09-28 DIAGNOSIS — Z170 Estrogen receptor positive status [ER+]: Secondary | ICD-10-CM | POA: Diagnosis not present

## 2023-09-28 LAB — RAD ONC ARIA SESSION SUMMARY
Course Elapsed Days: 7
Plan Fractions Treated to Date: 6
Plan Prescribed Dose Per Fraction: 2.67 Gy
Plan Total Fractions Prescribed: 15
Plan Total Prescribed Dose: 40.05 Gy
Reference Point Dosage Given to Date: 16.02 Gy
Reference Point Session Dosage Given: 2.67 Gy
Session Number: 6

## 2023-09-29 ENCOUNTER — Other Ambulatory Visit: Payer: Self-pay

## 2023-09-29 ENCOUNTER — Ambulatory Visit
Admission: RE | Admit: 2023-09-29 | Discharge: 2023-09-29 | Disposition: A | Discharge status: Home / Self Care | Payer: Medicare HMO | Source: Ambulatory Visit | Attending: Radiation Oncology | Admitting: Radiation Oncology

## 2023-09-29 DIAGNOSIS — Z170 Estrogen receptor positive status [ER+]: Secondary | ICD-10-CM | POA: Diagnosis not present

## 2023-09-29 DIAGNOSIS — C50511 Malignant neoplasm of lower-outer quadrant of right female breast: Secondary | ICD-10-CM | POA: Diagnosis not present

## 2023-09-29 DIAGNOSIS — Z510 Encounter for antineoplastic radiation therapy: Secondary | ICD-10-CM | POA: Diagnosis not present

## 2023-09-29 LAB — RAD ONC ARIA SESSION SUMMARY
Course Elapsed Days: 8
Plan Fractions Treated to Date: 7
Plan Prescribed Dose Per Fraction: 2.67 Gy
Plan Total Fractions Prescribed: 15
Plan Total Prescribed Dose: 40.05 Gy
Reference Point Dosage Given to Date: 18.69 Gy
Reference Point Session Dosage Given: 2.67 Gy
Session Number: 7

## 2023-09-30 ENCOUNTER — Other Ambulatory Visit: Payer: Self-pay | Admitting: Radiation Oncology

## 2023-09-30 ENCOUNTER — Encounter: Payer: Self-pay | Admitting: Radiation Oncology

## 2023-09-30 MED ORDER — TRAMADOL HCL 50 MG PO TABS: 50.0000 mg | ORAL_TABLET | Freq: Three times a day (TID) | ORAL | 0 refills | Status: DC | PRN

## 2023-09-30 NOTE — Progress Notes (Signed)
The patient called the on-call number complaining of increase pain in the breast area, as well as increased itching. No increase in erythema, warmth, no fever.  OTC pain meds not sufficient. I have called in pain medication for the weekend and the patient will use hydrocortisone prn for itching. She will present to the ER with worsening symptoms. This was relayed to her by the on-call service.

## 2023-10-02 ENCOUNTER — Ambulatory Visit
Admission: RE | Admit: 2023-10-02 | Discharge: 2023-10-02 | Disposition: A | Discharge status: Home / Self Care | Payer: Medicare HMO | Source: Ambulatory Visit | Attending: Radiation Oncology | Admitting: Radiation Oncology

## 2023-10-02 ENCOUNTER — Telehealth: Payer: Self-pay | Admitting: *Deleted

## 2023-10-02 ENCOUNTER — Other Ambulatory Visit: Payer: Self-pay

## 2023-10-02 ENCOUNTER — Inpatient Hospital Stay: Payer: Medicare HMO | Admitting: Physician Assistant

## 2023-10-02 DIAGNOSIS — C50511 Malignant neoplasm of lower-outer quadrant of right female breast: Secondary | ICD-10-CM | POA: Diagnosis not present

## 2023-10-02 DIAGNOSIS — Z510 Encounter for antineoplastic radiation therapy: Secondary | ICD-10-CM | POA: Diagnosis not present

## 2023-10-02 DIAGNOSIS — Z170 Estrogen receptor positive status [ER+]: Secondary | ICD-10-CM | POA: Diagnosis not present

## 2023-10-02 LAB — RAD ONC ARIA SESSION SUMMARY
Course Elapsed Days: 11
Plan Fractions Treated to Date: 8
Plan Prescribed Dose Per Fraction: 2.67 Gy
Plan Total Fractions Prescribed: 15
Plan Total Prescribed Dose: 40.05 Gy
Reference Point Dosage Given to Date: 21.36 Gy
Reference Point Session Dosage Given: 2.67 Gy
Session Number: 8

## 2023-10-02 NOTE — Telephone Encounter (Signed)
Received call from pt with complaint of right breast redness, warmth, and pain x 2 days.  Pt denies fever at this time.  Per MD pt needing to be seen by [facility] for further evaluation.  Appt scheduled, pt notified and verbalized understanding.

## 2023-10-03 ENCOUNTER — Other Ambulatory Visit: Payer: Self-pay

## 2023-10-03 ENCOUNTER — Ambulatory Visit: Payer: Medicare HMO

## 2023-10-03 ENCOUNTER — Ambulatory Visit
Admission: RE | Admit: 2023-10-03 | Discharge: 2023-10-03 | Disposition: A | Discharge status: Home / Self Care | Payer: Medicare HMO | Source: Ambulatory Visit | Attending: Radiation Oncology | Admitting: Radiation Oncology

## 2023-10-03 DIAGNOSIS — C50511 Malignant neoplasm of lower-outer quadrant of right female breast: Secondary | ICD-10-CM | POA: Diagnosis not present

## 2023-10-03 DIAGNOSIS — Z170 Estrogen receptor positive status [ER+]: Secondary | ICD-10-CM | POA: Diagnosis not present

## 2023-10-03 DIAGNOSIS — Z510 Encounter for antineoplastic radiation therapy: Secondary | ICD-10-CM | POA: Diagnosis not present

## 2023-10-03 LAB — RAD ONC ARIA SESSION SUMMARY
Course Elapsed Days: 12
Plan Fractions Treated to Date: 9
Plan Prescribed Dose Per Fraction: 2.67 Gy
Plan Total Fractions Prescribed: 15
Plan Total Prescribed Dose: 40.05 Gy
Reference Point Dosage Given to Date: 24.03 Gy
Reference Point Session Dosage Given: 2.67 Gy
Session Number: 9

## 2023-10-04 ENCOUNTER — Ambulatory Visit
Admission: RE | Admit: 2023-10-04 | Discharge: 2023-10-04 | Discharge status: Home / Self Care | Payer: Medicare HMO | Source: Ambulatory Visit | Attending: Radiation Oncology | Admitting: Radiation Oncology

## 2023-10-04 ENCOUNTER — Other Ambulatory Visit: Payer: Self-pay

## 2023-10-04 ENCOUNTER — Other Ambulatory Visit: Payer: Self-pay | Admitting: Radiation Oncology

## 2023-10-04 DIAGNOSIS — C50511 Malignant neoplasm of lower-outer quadrant of right female breast: Secondary | ICD-10-CM | POA: Diagnosis not present

## 2023-10-04 DIAGNOSIS — Z510 Encounter for antineoplastic radiation therapy: Secondary | ICD-10-CM | POA: Diagnosis not present

## 2023-10-04 DIAGNOSIS — Z170 Estrogen receptor positive status [ER+]: Secondary | ICD-10-CM | POA: Diagnosis not present

## 2023-10-04 LAB — RAD ONC ARIA SESSION SUMMARY
Course Elapsed Days: 13
Plan Fractions Treated to Date: 10
Plan Prescribed Dose Per Fraction: 2.67 Gy
Plan Total Fractions Prescribed: 15
Plan Total Prescribed Dose: 40.05 Gy
Reference Point Dosage Given to Date: 26.7 Gy
Reference Point Session Dosage Given: 2.67 Gy
Session Number: 10

## 2023-10-04 MED ORDER — ONDANSETRON HCL 4 MG PO TABS: 4.0000 mg | ORAL_TABLET | Freq: Three times a day (TID) | ORAL | 0 refills | Status: DC | PRN

## 2023-10-05 ENCOUNTER — Other Ambulatory Visit: Payer: Self-pay

## 2023-10-05 ENCOUNTER — Ambulatory Visit
Admission: RE | Admit: 2023-10-05 | Discharge: 2023-10-05 | Disposition: A | Discharge status: Home / Self Care | Payer: Medicare HMO | Source: Ambulatory Visit | Attending: Radiation Oncology | Admitting: Radiation Oncology

## 2023-10-05 DIAGNOSIS — C50511 Malignant neoplasm of lower-outer quadrant of right female breast: Secondary | ICD-10-CM | POA: Diagnosis not present

## 2023-10-05 DIAGNOSIS — Z170 Estrogen receptor positive status [ER+]: Secondary | ICD-10-CM | POA: Diagnosis not present

## 2023-10-05 DIAGNOSIS — Z510 Encounter for antineoplastic radiation therapy: Secondary | ICD-10-CM | POA: Diagnosis not present

## 2023-10-05 LAB — RAD ONC ARIA SESSION SUMMARY
Course Elapsed Days: 14
Plan Fractions Treated to Date: 11
Plan Prescribed Dose Per Fraction: 2.67 Gy
Plan Total Fractions Prescribed: 15
Plan Total Prescribed Dose: 40.05 Gy
Reference Point Dosage Given to Date: 29.37 Gy
Reference Point Session Dosage Given: 2.67 Gy
Session Number: 11

## 2023-10-06 ENCOUNTER — Other Ambulatory Visit: Payer: Self-pay

## 2023-10-06 ENCOUNTER — Ambulatory Visit
Admission: RE | Admit: 2023-10-06 | Discharge: 2023-10-06 | Disposition: A | Discharge status: Home / Self Care | Payer: Medicare HMO | Source: Ambulatory Visit | Attending: Radiation Oncology | Admitting: Radiation Oncology

## 2023-10-06 DIAGNOSIS — R69 Illness, unspecified: Secondary | ICD-10-CM | POA: Diagnosis not present

## 2023-10-06 DIAGNOSIS — Z170 Estrogen receptor positive status [ER+]: Secondary | ICD-10-CM | POA: Insufficient documentation

## 2023-10-06 DIAGNOSIS — C50511 Malignant neoplasm of lower-outer quadrant of right female breast: Secondary | ICD-10-CM | POA: Diagnosis not present

## 2023-10-06 DIAGNOSIS — Z510 Encounter for antineoplastic radiation therapy: Secondary | ICD-10-CM | POA: Diagnosis not present

## 2023-10-06 LAB — RAD ONC ARIA SESSION SUMMARY
Course Elapsed Days: 15
Plan Fractions Treated to Date: 12
Plan Prescribed Dose Per Fraction: 2.67 Gy
Plan Total Fractions Prescribed: 15
Plan Total Prescribed Dose: 40.05 Gy
Reference Point Dosage Given to Date: 32.04 Gy
Reference Point Session Dosage Given: 2.67 Gy
Session Number: 12

## 2023-10-09 ENCOUNTER — Ambulatory Visit
Admission: RE | Admit: 2023-10-09 | Discharge: 2023-10-09 | Disposition: A | Discharge status: Home / Self Care | Payer: Medicare HMO | Source: Ambulatory Visit | Attending: Radiation Oncology | Admitting: Radiation Oncology

## 2023-10-09 ENCOUNTER — Other Ambulatory Visit: Payer: Self-pay

## 2023-10-09 DIAGNOSIS — Z510 Encounter for antineoplastic radiation therapy: Secondary | ICD-10-CM | POA: Diagnosis not present

## 2023-10-09 DIAGNOSIS — Z170 Estrogen receptor positive status [ER+]: Secondary | ICD-10-CM | POA: Diagnosis not present

## 2023-10-09 DIAGNOSIS — C50511 Malignant neoplasm of lower-outer quadrant of right female breast: Secondary | ICD-10-CM | POA: Diagnosis not present

## 2023-10-09 LAB — RAD ONC ARIA SESSION SUMMARY
Course Elapsed Days: 18
Plan Fractions Treated to Date: 13
Plan Prescribed Dose Per Fraction: 2.67 Gy
Plan Total Fractions Prescribed: 15
Plan Total Prescribed Dose: 40.05 Gy
Reference Point Dosage Given to Date: 34.71 Gy
Reference Point Session Dosage Given: 2.67 Gy
Session Number: 13

## 2023-10-10 ENCOUNTER — Ambulatory Visit: Payer: Medicare HMO

## 2023-10-10 ENCOUNTER — Ambulatory Visit: Payer: Medicare HMO | Admitting: Radiation Oncology

## 2023-10-10 ENCOUNTER — Other Ambulatory Visit: Payer: Self-pay

## 2023-10-10 ENCOUNTER — Ambulatory Visit
Admission: RE | Admit: 2023-10-10 | Discharge: 2023-10-10 | Disposition: A | Discharge status: Home / Self Care | Payer: Medicare HMO | Source: Ambulatory Visit | Attending: Radiation Oncology | Admitting: Radiation Oncology

## 2023-10-10 DIAGNOSIS — M8588 Other specified disorders of bone density and structure, other site: Secondary | ICD-10-CM | POA: Diagnosis not present

## 2023-10-10 DIAGNOSIS — Z853 Personal history of malignant neoplasm of breast: Secondary | ICD-10-CM | POA: Diagnosis not present

## 2023-10-10 DIAGNOSIS — E2839 Other primary ovarian failure: Secondary | ICD-10-CM | POA: Diagnosis not present

## 2023-10-10 DIAGNOSIS — Z510 Encounter for antineoplastic radiation therapy: Secondary | ICD-10-CM | POA: Diagnosis not present

## 2023-10-10 DIAGNOSIS — Z170 Estrogen receptor positive status [ER+]: Secondary | ICD-10-CM | POA: Diagnosis not present

## 2023-10-10 DIAGNOSIS — N958 Other specified menopausal and perimenopausal disorders: Secondary | ICD-10-CM | POA: Diagnosis not present

## 2023-10-10 DIAGNOSIS — C50511 Malignant neoplasm of lower-outer quadrant of right female breast: Secondary | ICD-10-CM | POA: Diagnosis not present

## 2023-10-10 LAB — RAD ONC ARIA SESSION SUMMARY
Course Elapsed Days: 19
Plan Fractions Treated to Date: 14
Plan Prescribed Dose Per Fraction: 2.67 Gy
Plan Total Fractions Prescribed: 15
Plan Total Prescribed Dose: 40.05 Gy
Reference Point Dosage Given to Date: 37.38 Gy
Reference Point Session Dosage Given: 2.67 Gy
Session Number: 14

## 2023-10-11 ENCOUNTER — Other Ambulatory Visit: Payer: Self-pay

## 2023-10-11 ENCOUNTER — Ambulatory Visit
Admission: RE | Admit: 2023-10-11 | Discharge: 2023-10-11 | Disposition: A | Discharge status: Home / Self Care | Payer: Medicare HMO | Source: Ambulatory Visit | Attending: Radiation Oncology | Admitting: Radiation Oncology

## 2023-10-11 DIAGNOSIS — C50511 Malignant neoplasm of lower-outer quadrant of right female breast: Secondary | ICD-10-CM | POA: Diagnosis not present

## 2023-10-11 DIAGNOSIS — Z170 Estrogen receptor positive status [ER+]: Secondary | ICD-10-CM | POA: Diagnosis not present

## 2023-10-11 DIAGNOSIS — Z510 Encounter for antineoplastic radiation therapy: Secondary | ICD-10-CM | POA: Diagnosis not present

## 2023-10-11 LAB — RAD ONC ARIA SESSION SUMMARY
Course Elapsed Days: 20
Plan Fractions Treated to Date: 15
Plan Prescribed Dose Per Fraction: 2.67 Gy
Plan Total Fractions Prescribed: 15
Plan Total Prescribed Dose: 40.05 Gy
Reference Point Dosage Given to Date: 40.05 Gy
Reference Point Session Dosage Given: 2.67 Gy
Session Number: 15

## 2023-10-12 ENCOUNTER — Other Ambulatory Visit: Payer: Self-pay

## 2023-10-12 ENCOUNTER — Encounter: Payer: Self-pay | Admitting: Hematology and Oncology

## 2023-10-12 ENCOUNTER — Ambulatory Visit
Admission: RE | Admit: 2023-10-12 | Discharge: 2023-10-12 | Disposition: A | Discharge status: Home / Self Care | Payer: Medicare HMO | Source: Ambulatory Visit | Attending: Radiation Oncology | Admitting: Radiation Oncology

## 2023-10-12 DIAGNOSIS — Z510 Encounter for antineoplastic radiation therapy: Secondary | ICD-10-CM | POA: Diagnosis not present

## 2023-10-12 DIAGNOSIS — Z170 Estrogen receptor positive status [ER+]: Secondary | ICD-10-CM | POA: Diagnosis not present

## 2023-10-12 DIAGNOSIS — C50511 Malignant neoplasm of lower-outer quadrant of right female breast: Secondary | ICD-10-CM | POA: Diagnosis not present

## 2023-10-12 LAB — RAD ONC ARIA SESSION SUMMARY
Course Elapsed Days: 21
Plan Fractions Treated to Date: 1
Plan Prescribed Dose Per Fraction: 2 Gy
Plan Total Fractions Prescribed: 5
Plan Total Prescribed Dose: 10 Gy
Reference Point Dosage Given to Date: 2 Gy
Reference Point Session Dosage Given: 2 Gy
Session Number: 16

## 2023-10-13 ENCOUNTER — Other Ambulatory Visit: Payer: Self-pay

## 2023-10-13 ENCOUNTER — Ambulatory Visit
Admission: RE | Admit: 2023-10-13 | Discharge: 2023-10-13 | Disposition: A | Discharge status: Home / Self Care | Payer: Medicare HMO | Source: Ambulatory Visit | Attending: Radiation Oncology | Admitting: Radiation Oncology

## 2023-10-13 DIAGNOSIS — Z510 Encounter for antineoplastic radiation therapy: Secondary | ICD-10-CM | POA: Diagnosis not present

## 2023-10-13 DIAGNOSIS — C50511 Malignant neoplasm of lower-outer quadrant of right female breast: Secondary | ICD-10-CM | POA: Diagnosis not present

## 2023-10-13 DIAGNOSIS — Z170 Estrogen receptor positive status [ER+]: Secondary | ICD-10-CM | POA: Diagnosis not present

## 2023-10-13 LAB — RAD ONC ARIA SESSION SUMMARY
Course Elapsed Days: 22
Plan Fractions Treated to Date: 2
Plan Prescribed Dose Per Fraction: 2 Gy
Plan Total Fractions Prescribed: 5
Plan Total Prescribed Dose: 10 Gy
Reference Point Dosage Given to Date: 4 Gy
Reference Point Session Dosage Given: 2 Gy
Session Number: 17

## 2023-10-16 ENCOUNTER — Ambulatory Visit
Admission: RE | Admit: 2023-10-16 | Discharge: 2023-10-16 | Disposition: A | Discharge status: Home / Self Care | Payer: Medicare HMO | Source: Ambulatory Visit | Attending: Radiation Oncology | Admitting: Radiation Oncology

## 2023-10-16 ENCOUNTER — Ambulatory Visit: Payer: Medicare HMO

## 2023-10-16 ENCOUNTER — Other Ambulatory Visit: Payer: Self-pay

## 2023-10-16 DIAGNOSIS — Z170 Estrogen receptor positive status [ER+]: Secondary | ICD-10-CM | POA: Diagnosis not present

## 2023-10-16 DIAGNOSIS — Z510 Encounter for antineoplastic radiation therapy: Secondary | ICD-10-CM | POA: Diagnosis not present

## 2023-10-16 DIAGNOSIS — C50511 Malignant neoplasm of lower-outer quadrant of right female breast: Secondary | ICD-10-CM | POA: Diagnosis not present

## 2023-10-16 DIAGNOSIS — E7800 Pure hypercholesterolemia, unspecified: Secondary | ICD-10-CM | POA: Diagnosis not present

## 2023-10-16 LAB — RAD ONC ARIA SESSION SUMMARY
Course Elapsed Days: 25
Plan Fractions Treated to Date: 3
Plan Prescribed Dose Per Fraction: 2 Gy
Plan Total Fractions Prescribed: 5
Plan Total Prescribed Dose: 10 Gy
Reference Point Dosage Given to Date: 6 Gy
Reference Point Session Dosage Given: 2 Gy
Session Number: 18

## 2023-10-17 ENCOUNTER — Ambulatory Visit: Payer: Medicare HMO

## 2023-10-17 ENCOUNTER — Other Ambulatory Visit: Payer: Self-pay

## 2023-10-17 ENCOUNTER — Ambulatory Visit
Admission: RE | Admit: 2023-10-17 | Discharge: 2023-10-17 | Disposition: A | Discharge status: Home / Self Care | Payer: Medicare HMO | Source: Ambulatory Visit | Attending: Radiation Oncology | Admitting: Radiation Oncology

## 2023-10-17 DIAGNOSIS — Z510 Encounter for antineoplastic radiation therapy: Secondary | ICD-10-CM | POA: Diagnosis not present

## 2023-10-17 DIAGNOSIS — C50511 Malignant neoplasm of lower-outer quadrant of right female breast: Secondary | ICD-10-CM | POA: Diagnosis not present

## 2023-10-17 DIAGNOSIS — Z170 Estrogen receptor positive status [ER+]: Secondary | ICD-10-CM | POA: Diagnosis not present

## 2023-10-17 LAB — RAD ONC ARIA SESSION SUMMARY
Course Elapsed Days: 26
Plan Fractions Treated to Date: 4
Plan Prescribed Dose Per Fraction: 2 Gy
Plan Total Fractions Prescribed: 5
Plan Total Prescribed Dose: 10 Gy
Reference Point Dosage Given to Date: 8 Gy
Reference Point Session Dosage Given: 2 Gy
Session Number: 19

## 2023-10-18 ENCOUNTER — Other Ambulatory Visit: Payer: Self-pay

## 2023-10-18 ENCOUNTER — Ambulatory Visit
Admission: RE | Admit: 2023-10-18 | Discharge: 2023-10-18 | Disposition: A | Discharge status: Home / Self Care | Payer: Medicare HMO | Source: Ambulatory Visit | Attending: Radiation Oncology | Admitting: Radiation Oncology

## 2023-10-18 DIAGNOSIS — Z510 Encounter for antineoplastic radiation therapy: Secondary | ICD-10-CM | POA: Diagnosis not present

## 2023-10-18 DIAGNOSIS — C50511 Malignant neoplasm of lower-outer quadrant of right female breast: Secondary | ICD-10-CM | POA: Diagnosis not present

## 2023-10-18 DIAGNOSIS — Z170 Estrogen receptor positive status [ER+]: Secondary | ICD-10-CM | POA: Diagnosis not present

## 2023-10-18 LAB — RAD ONC ARIA SESSION SUMMARY
Course Elapsed Days: 27
Plan Fractions Treated to Date: 5
Plan Prescribed Dose Per Fraction: 2 Gy
Plan Total Fractions Prescribed: 5
Plan Total Prescribed Dose: 10 Gy
Reference Point Dosage Given to Date: 10 Gy
Reference Point Session Dosage Given: 2 Gy
Session Number: 20

## 2023-10-19 ENCOUNTER — Inpatient Hospital Stay: Payer: Medicare HMO | Attending: Hematology and Oncology | Admitting: Hematology and Oncology

## 2023-10-19 VITALS — BP 135/74 | HR 76 | Temp 97.5°F | Resp 18 | Ht 61.0 in | Wt 148.9 lb

## 2023-10-19 DIAGNOSIS — C50511 Malignant neoplasm of lower-outer quadrant of right female breast: Secondary | ICD-10-CM | POA: Insufficient documentation

## 2023-10-19 DIAGNOSIS — Z79899 Other long term (current) drug therapy: Secondary | ICD-10-CM | POA: Diagnosis not present

## 2023-10-19 DIAGNOSIS — Z79811 Long term (current) use of aromatase inhibitors: Secondary | ICD-10-CM | POA: Diagnosis not present

## 2023-10-19 DIAGNOSIS — Z170 Estrogen receptor positive status [ER+]: Secondary | ICD-10-CM | POA: Diagnosis not present

## 2023-10-19 DIAGNOSIS — Z923 Personal history of irradiation: Secondary | ICD-10-CM | POA: Diagnosis not present

## 2023-10-19 MED ORDER — ANASTROZOLE 1 MG PO TABS: 1.0000 mg | ORAL_TABLET | Freq: Every day | ORAL | 3 refills | Status: DC

## 2023-10-19 NOTE — Assessment & Plan Note (Signed)
08/10/2023:Right lumpectomy: Grade 2 IDC 1 cm, margins negative, no evidence of lymphovascular invasion, ER 90%, PR 60%, HER2 negative, Ki-67 5%    Treatment plan: Oncotype DX: 22 (8% risk of distant recurrence) Adjuvant radiation therapy 09/22/2023-10/18/2023 Adjuvant antiestrogen therapy starting 11/05/2023  Anastrozole counseling: We discussed the risks and benefits of anti-estrogen therapy with aromatase inhibitors. These include but not limited to insomnia, hot flashes, mood changes, vaginal dryness, bone density loss, and weight gain. We strongly believe that the benefits far outweigh the risks. Patient understands these risks and consented to starting treatment. Planned treatment duration is 7 years.  Return to clinic in 3 months for survivorship care plan was

## 2023-10-19 NOTE — Radiation Completion Notes (Signed)
Patient Name: Angelica Hartman, YONG MRN: [id number] Date of Birth: 1956-04-29 Referring Physician: Milus Height, M.D. Date of Service: 2023-10-19 Radiation Oncologist: Arnette Schaumann, M.D. [facility] [place]                             RADIATION ONCOLOGY END OF TREATMENT NOTE     Diagnosis: C50.111 Malignant neoplasm of central portion of right female breast Staging on 2023-08-02: Malignant neoplasm of lower-outer quadrant of right breast of female, estrogen receptor positive (HCC) T=cT1b, N=cN0, M=cM0 Intent: Curative     ==========DELIVERED PLANS==========  First Treatment Date: 2023-09-21 - Last Treatment Date: 2023-10-18   Plan Name: Breast_R Site: Breast, Right Technique: 3D Mode: Photon Dose Per Fraction: 2.67 Gy Prescribed Dose (Delivered / Prescribed): 40.05 Gy / 40.05 Gy Prescribed Fxs (Delivered / Prescribed): 15 / 15   Plan Name: Breast_R_Bst Site: Breast, Right Technique: 3D Mode: Photon Dose Per Fraction: 2 Gy Prescribed Dose (Delivered / Prescribed): 10 Gy / 10 Gy Prescribed Fxs (Delivered / Prescribed): 5 / 5     ==========ON TREATMENT VISIT DATES========== 2023-09-26, 2023-10-02, 2023-10-10, 2023-10-16     ==========UPCOMING VISITS==========       ==========APPENDIX - ON TREATMENT VISIT NOTES==========   See weekly On Treatment Notes in Epic for details.

## 2023-10-19 NOTE — Progress Notes (Signed)
Patient Care Team: Milus Height, [place] as PCP - General (Physician Assistant) Donnelly Angelica, RN as Oncology Nurse Navigator Pershing Proud, RN as Oncology Nurse Navigator Serena Croissant, MD as Consulting Physician (Hematology and Oncology) Antony Blackbird, MD as Consulting Physician (Radiation Oncology) Emelia Loron, MD as Consulting Physician (General Surgery)  DIAGNOSIS:  Encounter Diagnosis  Name Primary?   Malignant neoplasm of lower-outer quadrant of right breast of female, estrogen receptor positive (HCC) Yes    SUMMARY OF ONCOLOGIC HISTORY: Oncology History  Malignant neoplasm of lower-outer quadrant of right breast of female, estrogen receptor positive (HCC)  07/25/2023 Initial Diagnosis   Palpable right breast lump by ultrasound measured 0.7 cm at 7 o'clock position, biopsy: Grade 2 IDC ER 90%, PR 60%, Ki67 5%, FISH negative for HER2   08/10/2023 Surgery   Right lumpectomy: Grade 2 IDC 1 cm, margins negative, no evidence of lymphovascular invasion, ER 90%, PR 60%, HER2 negative, Ki-67 5%     CHIEF COMPLIANT: Follow-up after radiation therapy  HISTORY OF PRESENT ILLNESS:   History of Present Illness   The patient, with a recent history of breast cancer treated with radiation therapy, presents for follow-up. She reports significant skin redness and soreness, which she describes as "horrible," following the completion of her radiation treatment. She has been using Benadryl cream, which seems to be helping. She also reports significant fatigue, which has been impacting her daily activities. She describes this as unusual for her, as she is typically very active. She also reports a recent illness, which she describes as flu-like, with symptoms of vomiting and body aches.  The patient also discusses concerns about starting anastrozole, a hormone therapy for breast cancer. She expresses concerns about potential side effects, including hot flashes, joint stiffness, and hair  thinning. She requests to start on a low dose and to take the medication every other day. She also inquires about the timing of a flu shot and getting a tattoo.         ALLERGIES:  is allergic to latex, pseudoephedrine hcl, wound dressing adhesive, codeine, and penicillins.  MEDICATIONS:  Current Outpatient Medications  Medication Sig Dispense Refill   [START ON 11/05/2023] anastrozole (ARIMIDEX) 1 MG tablet Take 1 tablet (1 mg total) by mouth daily. 90 tablet 3   atorvastatin (LIPITOR) 10 MG tablet Take 10 mg by mouth daily.     cetirizine (ZYRTEC) 10 MG tablet Take 1 tablet (10 mg total) by mouth daily. 30 tablet 0   Collagen Hydrolysate POWD by Does not apply route. 2 tablespoons a day     ibuprofen (ADVIL) 200 MG tablet Take 400 mg by mouth every 6 (six) hours as needed for mild pain, headache or moderate pain.     LORazepam (ATIVAN) 0.5 MG tablet Take 0.5 mg by mouth 2 (two) times daily as needed for anxiety.      MAGNESIUM GLUCONATE PO Take 1,400 mg by mouth at bedtime. 350 mg each     Omeprazole 20 MG TBDD Take 20 mg by mouth daily.     ondansetron (ZOFRAN) 4 MG tablet Take 1 tablet (4 mg total) by mouth every 8 (eight) hours as needed for nausea or vomiting. 20 tablet 0   Probiotic Product (PROBIOTIC PO) Take 1 tablet by mouth daily. 25 billion cfu     sertraline (ZOLOFT) 25 MG tablet Take 1 tablet (25 mg total) by mouth daily. Increase to 2 tablets and 7 days. (Patient taking differently: Take 25 mg by mouth daily.)  60 tablet 1   traMADol (ULTRAM) 50 MG tablet Take 1 tablet (50 mg total) by mouth every 8 (eight) hours as needed. 10 tablet 0   vitamin C (ASCORBIC ACID) 250 MG tablet Take 500 mg by mouth daily.     No current facility-administered medications for this visit.    PHYSICAL EXAMINATION: ECOG PERFORMANCE STATUS: 1 - Symptomatic but completely ambulatory  Vitals:   10/19/23 1338  BP: 135/74  Pulse: 76  Resp: 18  Temp: (!) 97.5 F (36.4 C)  SpO2: 100%   Filed  Weights   10/19/23 1338  Weight: 148 lb 14.4 oz (67.5 kg)      LABORATORY DATA:  I have reviewed the data as listed    Latest Ref Rng & Units 08/02/2023   12:04 PM 08/19/2019    7:06 PM 08/19/2019    6:58 PM  CMP  Glucose 70 - 99 mg/dL 93  86  92   BUN 8 - 23 mg/dL 20  15  15    Creatinine 0.44 - 1.00 mg/dL 2.84  1.32  4.40   Sodium 135 - 145 mmol/L 140  140  139   Potassium 3.5 - 5.1 mmol/L 4.2  3.5  3.6   Chloride 98 - 111 mmol/L 105  104  105   CO2 22 - 32 mmol/L 29   23   Calcium 8.9 - 10.3 mg/dL 9.2   9.2   Total Protein 6.5 - 8.1 g/dL 6.8   7.7   Total Bilirubin 0.3 - 1.2 mg/dL 0.3   0.3   Alkaline Phos 38 - 126 U/L 75   46   AST 15 - 41 U/L 18   22   ALT 0 - 44 U/L 14   18     Lab Results  Component Value Date   WBC 6.8 08/02/2023   HGB 13.6 08/02/2023   HCT 38.4 08/02/2023   MCV 90.8 08/02/2023   PLT 235 08/02/2023   NEUTROABS 3.4 08/02/2023    ASSESSMENT & PLAN:  Malignant neoplasm of lower-outer quadrant of right breast of female, estrogen receptor positive (HCC) 08/10/2023:Right lumpectomy: Grade 2 IDC 1 cm, margins negative, no evidence of lymphovascular invasion, ER 90%, PR 60%, HER2 negative, Ki-67 5%    Treatment plan: Oncotype DX: 22 (8% risk of distant recurrence) Adjuvant radiation therapy 09/22/2023-10/18/2023 Adjuvant antiestrogen therapy starting 11/05/2023  Anastrozole counseling: We discussed the risks and benefits of anti-estrogen therapy with aromatase inhibitors. These include but not limited to insomnia, hot flashes, mood changes, vaginal dryness, bone density loss, and weight gain. We strongly believe that the benefits far outweigh the risks. Patient understands these risks and consented to starting treatment. Planned treatment duration is 7 years.  She will take anastrozole every other day and see how she tolerates it. She does not like taking medications and is very worried about adverse effects.  Return to clinic in 3 months for  survivorship care plan was   No orders of the defined types were placed in this encounter.  The patient has a good understanding of the overall plan. she agrees with it. she will call with any problems that may develop before the next visit here. Total time spent: 30 mins including face to face time and time spent for planning, charting and co-ordination of care   Tamsen Meek, MD 10/19/23

## 2023-11-05 DIAGNOSIS — R69 Illness, unspecified: Secondary | ICD-10-CM | POA: Diagnosis not present

## 2023-11-22 ENCOUNTER — Encounter: Payer: Self-pay | Admitting: Radiation Oncology

## 2023-11-22 NOTE — Progress Notes (Signed)
  Radiation Oncology         [phone] ________________________________  Name: Angelica Hartman MRN: [id number]  Date: 11/23/2023  DOB: 1956/02/15  End of Treatment Note  Diagnosis:  Stage IA (cT1b, cN0, cM0) Right Breast LOQ, Invasive ductal carcinoma, ER+ / PR+ / Her2-, Grade 2: s/p lumpectomy without SLN biopsies      Indication for treatment: Curative        Radiation treatment dates: First Treatment Date: 2023-09-21 - Last Treatment Date: 2023-10-18  Site/Dose/Technique/Mode:   Site: Breast, Right  Technique: 3D Mode: Photon Dose Per Fraction: 2.67 Gy Prescribed Dose (Delivered / Prescribed): 40.05 Gy / 40.05 Gy Prescribed Fxs (Delivered / Prescribed): 15 / 15   Site: Breast, Right - Boost  Technique: 3D Mode: Photon Dose Per Fraction: 2 Gy Prescribed Dose (Delivered / Prescribed): 10 Gy / 10 Gy Prescribed Fxs (Delivered / Prescribed): 5 / 5  Narrative: The patient tolerated radiation treatment relatively well. During her final weekly treatment check on 10/16/23, the patient endorsed skin pain, fatigue, and a red, itchy rash. Physical exam performed that same date showed hyperpigmentation changes to the right breast area and significant radiation dermatitis in the upper inner and medial aspect of the breast area without any drainage or skin breakdown appreciated.   Plan: The patient has completed radiation treatment. The patient will return to radiation oncology clinic for routine followup in one month. I advised them to call or return sooner if they have any questions or concerns related to their recovery or treatment.  -----------------------------------  Billie Lade, PhD, MD  This document serves as a record of services personally performed by Antony Blackbird, MD. It was created on his behalf by Neena Rhymes, a trained medical scribe. The creation of this record is based on the scribe's personal observations and the provider's statements to them. This document has been  checked and approved by the attending provider.

## 2023-11-22 NOTE — Progress Notes (Incomplete)
Radiation Oncology         [phone] ________________________________  Name: Angelica Hartman MRN: [id number]  Date: 11/23/2023  DOB: [birth year]  Follow-Up Visit Note  CC: Redmon, [place], [place]  Redmon, [place], [place]  No diagnosis found.  Diagnosis:  Stage IA (cT1b, cN0, cM0) Right Breast LOQ, Invasive ductal carcinoma, ER+ / PR+ / Her2-, Grade 2: s/p lumpectomy without SLN biopsies      Interval Since Last Radiation: 1 month and 6 days   Indication for treatment: Curative         Radiation treatment dates: First Treatment Date: 2023-09-21 - Last Treatment Date: 2023-10-18   Site/Dose/Technique/Mode:    Site: Breast, Right  Technique: 3D Mode: Photon Dose Per Fraction: 2.67 Gy Prescribed Dose (Delivered / Prescribed): 40.05 Gy / 40.05 Gy Prescribed Fxs (Delivered / Prescribed): 15 / 15   Site: Breast, Right - Boost  Technique: 3D Mode: Photon Dose Per Fraction: 2 Gy Prescribed Dose (Delivered / Prescribed): 10 Gy / 10 Gy Prescribed Fxs (Delivered / Prescribed): 5 / 5  Narrative:  The patient returns today for routine follow-up. She tolerated radiation treatment relatively well. During her final weekly treatment check on 10/16/23, the patient endorsed skin pain, fatigue, and a red, itchy rash. Physical exam performed that same date showed hyperpigmentation changes to the right breast area and significant radiation dermatitis in the upper inner and medial aspect of the breast area without any drainage or skin breakdown appreciated.     Since completing radiation therapy, she followed up with Dr. Pamelia Hoit on 10/19/23.  During which time, the patient reported having significant skin redness and soreness, which she described as "horrible," following the completion of her radiation treatment. She also reported having significant fatigue, which impacted her daily activities, and recent flu like symptoms consisting of vomiting and body aches. For her radiation skin changes, she was noted to  using benadryl cream which does provide her with some relief.   In terms of antiestrogen therapy, she has agreed to Dr. Earmon Phoenix recommendation of anastrozole. Planned treatment duration is 7 years. She will return to Dr. Pamelia Hoit in several months to review her survivorship care plan.     Pertinent imaging performed in the interval includes a bone scan on 10/10/23 which showed findings consistent with osteopenia.   ***  Allergies:  is allergic to latex, pseudoephedrine hcl, wound dressing adhesive, codeine, and penicillins.  Meds: Current Outpatient Medications  Medication Sig Dispense Refill   anastrozole (ARIMIDEX) 1 MG tablet Take 1 tablet (1 mg total) by mouth daily. 90 tablet 3   atorvastatin (LIPITOR) 10 MG tablet Take 10 mg by mouth daily.     cetirizine (ZYRTEC) 10 MG tablet Take 1 tablet (10 mg total) by mouth daily. 30 tablet 0   Collagen Hydrolysate POWD by Does not apply route. 2 tablespoons a day     ibuprofen (ADVIL) 200 MG tablet Take 400 mg by mouth every 6 (six) hours as needed for mild pain, headache or moderate pain.     LORazepam (ATIVAN) 0.5 MG tablet Take 0.5 mg by mouth 2 (two) times daily as needed for anxiety.      MAGNESIUM GLUCONATE PO Take 1,400 mg by mouth at bedtime. 350 mg each     Omeprazole 20 MG TBDD Take 20 mg by mouth daily.     ondansetron (ZOFRAN) 4 MG tablet Take 1 tablet (4 mg total) by mouth every 8 (eight) hours as needed for nausea or vomiting. 20 tablet 0  Probiotic Product (PROBIOTIC PO) Take 1 tablet by mouth daily. 25 billion cfu     sertraline (ZOLOFT) 25 MG tablet Take 1 tablet (25 mg total) by mouth daily. Increase to 2 tablets and 7 days. (Patient taking differently: Take 25 mg by mouth daily.) 60 tablet 1   traMADol (ULTRAM) 50 MG tablet Take 1 tablet (50 mg total) by mouth every 8 (eight) hours as needed. 10 tablet 0   vitamin C (ASCORBIC ACID) 250 MG tablet Take 500 mg by mouth daily.     No current facility-administered medications for  this encounter.    Physical Findings: The patient is in no acute distress. Patient is alert and oriented.  vitals were not taken for this visit. .  No significant changes. Lungs are clear to auscultation bilaterally. Heart has regular rate and rhythm. No palpable cervical, supraclavicular, or axillary adenopathy. Abdomen soft, non-tender, normal bowel sounds.  Left Breast: no palpable mass, nipple discharge or bleeding. Right Breast: ***  Lab Findings: Lab Results  Component Value Date   WBC 6.8 08/02/2023   HGB 13.6 08/02/2023   HCT 38.4 08/02/2023   MCV 90.8 08/02/2023   PLT 235 08/02/2023    Radiographic Findings: No results found.  Impression:  Stage IA (cT1b, cN0, cM0) Right Breast LOQ, Invasive ductal carcinoma, ER+ / PR+ / Her2-, Grade 2: s/p lumpectomy without SLN biopsies      The patient is recovering from the effects of radiation.  ***  Plan:  ***   *** minutes of total time was spent for this patient encounter, including preparation, face-to-face counseling with the patient and coordination of care, physical exam, and documentation of the encounter. ____________________________________  Billie Lade, PhD, MD  This document serves as a record of services personally performed by Antony Blackbird, MD. It was created on his behalf by Neena Rhymes, a trained medical scribe. The creation of this record is based on the scribe's personal observations and the provider's statements to them. This document has been checked and approved by the attending provider.

## 2023-11-23 ENCOUNTER — Encounter: Payer: Self-pay | Admitting: Radiation Oncology

## 2023-11-23 ENCOUNTER — Ambulatory Visit
Admission: RE | Admit: 2023-11-23 | Discharge: 2023-11-23 | Disposition: A | Discharge status: Home / Self Care | Payer: Medicare HMO | Source: Ambulatory Visit | Attending: Radiation Oncology | Admitting: Radiation Oncology

## 2023-11-23 VITALS — BP 112/82 | HR 76 | Temp 97.5°F | Resp 18 | Ht 61.0 in | Wt 152.5 lb

## 2023-11-23 DIAGNOSIS — Z170 Estrogen receptor positive status [ER+]: Secondary | ICD-10-CM | POA: Diagnosis not present

## 2023-11-23 DIAGNOSIS — C50511 Malignant neoplasm of lower-outer quadrant of right female breast: Secondary | ICD-10-CM | POA: Diagnosis not present

## 2023-11-23 HISTORY — DX: Personal history of irradiation: Z92.3

## 2023-11-23 NOTE — Progress Notes (Signed)
Angelica Hartman is here today for follow up post radiation to the breast.   Breast Side:Right   They completed their radiation on: 10/18/23   Does the patient complain of any of the following: Post radiation skin issues:  Patient reports having a "pimple" to the breast that has caused some discomfort.  Breast Tenderness: Yes Breast Swelling: No Lymphadema: No  Range of Motion limitations: No Fatigue post radiation: No Appetite good/fair/poor: good  Additional comments if applicable:  Patient started anastrozole 11/05/23.   BP 112/82 (BP Location: Left Arm, Patient Position: Sitting)   Pulse 76   Temp (!) 97.5 F (36.4 C) (Temporal)   Resp 18   Ht 5\' 1"  (1.549 m)   Wt 152 lb 8 oz (69.2 kg)   SpO2 98%   BMI 28.81 kg/m

## 2024-01-26 DIAGNOSIS — R300 Dysuria: Secondary | ICD-10-CM | POA: Diagnosis not present

## 2024-01-26 DIAGNOSIS — J988 Other specified respiratory disorders: Secondary | ICD-10-CM | POA: Diagnosis not present

## 2024-01-26 DIAGNOSIS — N368 Other specified disorders of urethra: Secondary | ICD-10-CM | POA: Diagnosis not present

## 2024-01-27 ENCOUNTER — Telehealth: Payer: Self-pay | Admitting: Adult Health

## 2024-01-27 NOTE — Telephone Encounter (Signed)
 Rescheduled appointment per provider PAL. Left VM with the changes made to the patients upcoming appointment.

## 2024-01-29 DIAGNOSIS — N342 Other urethritis: Secondary | ICD-10-CM | POA: Diagnosis not present

## 2024-01-29 DIAGNOSIS — J309 Allergic rhinitis, unspecified: Secondary | ICD-10-CM | POA: Insufficient documentation

## 2024-01-29 DIAGNOSIS — B9681 Helicobacter pylori [H. pylori] as the cause of diseases classified elsewhere: Secondary | ICD-10-CM | POA: Insufficient documentation

## 2024-01-29 DIAGNOSIS — M8580 Other specified disorders of bone density and structure, unspecified site: Secondary | ICD-10-CM | POA: Insufficient documentation

## 2024-01-29 DIAGNOSIS — R319 Hematuria, unspecified: Secondary | ICD-10-CM | POA: Insufficient documentation

## 2024-01-29 DIAGNOSIS — L570 Actinic keratosis: Secondary | ICD-10-CM | POA: Insufficient documentation

## 2024-01-29 DIAGNOSIS — Z1339 Encounter for screening examination for other mental health and behavioral disorders: Secondary | ICD-10-CM | POA: Diagnosis not present

## 2024-01-29 DIAGNOSIS — F419 Anxiety disorder, unspecified: Secondary | ICD-10-CM | POA: Insufficient documentation

## 2024-01-30 ENCOUNTER — Telehealth: Payer: Self-pay | Admitting: *Deleted

## 2024-01-30 ENCOUNTER — Encounter: Payer: Medicare HMO | Admitting: Adult Health

## 2024-01-30 NOTE — Telephone Encounter (Signed)
 Pt called to cancel SCP appt. Pt stated, "I'm just not interested in the class." Pt has appt has been cancelled as requested.

## 2024-01-31 ENCOUNTER — Inpatient Hospital Stay: Payer: Medicare HMO | Admitting: Adult Health

## 2024-02-15 DIAGNOSIS — R5383 Other fatigue: Secondary | ICD-10-CM | POA: Diagnosis not present

## 2024-02-26 DIAGNOSIS — M9902 Segmental and somatic dysfunction of thoracic region: Secondary | ICD-10-CM | POA: Diagnosis not present

## 2024-02-26 DIAGNOSIS — M5124 Other intervertebral disc displacement, thoracic region: Secondary | ICD-10-CM | POA: Diagnosis not present

## 2024-02-26 DIAGNOSIS — M9901 Segmental and somatic dysfunction of cervical region: Secondary | ICD-10-CM | POA: Diagnosis not present

## 2024-02-26 DIAGNOSIS — M5022 Other cervical disc displacement, mid-cervical region, unspecified level: Secondary | ICD-10-CM | POA: Diagnosis not present

## 2024-02-26 DIAGNOSIS — M5126 Other intervertebral disc displacement, lumbar region: Secondary | ICD-10-CM | POA: Diagnosis not present

## 2024-02-26 DIAGNOSIS — M9903 Segmental and somatic dysfunction of lumbar region: Secondary | ICD-10-CM | POA: Diagnosis not present

## 2024-02-28 DIAGNOSIS — M9903 Segmental and somatic dysfunction of lumbar region: Secondary | ICD-10-CM | POA: Diagnosis not present

## 2024-02-28 DIAGNOSIS — M9901 Segmental and somatic dysfunction of cervical region: Secondary | ICD-10-CM | POA: Diagnosis not present

## 2024-02-28 DIAGNOSIS — M5126 Other intervertebral disc displacement, lumbar region: Secondary | ICD-10-CM | POA: Diagnosis not present

## 2024-02-28 DIAGNOSIS — M5124 Other intervertebral disc displacement, thoracic region: Secondary | ICD-10-CM | POA: Diagnosis not present

## 2024-02-28 DIAGNOSIS — M5022 Other cervical disc displacement, mid-cervical region, unspecified level: Secondary | ICD-10-CM | POA: Diagnosis not present

## 2024-02-28 DIAGNOSIS — M9902 Segmental and somatic dysfunction of thoracic region: Secondary | ICD-10-CM | POA: Diagnosis not present

## 2024-03-04 DIAGNOSIS — M9901 Segmental and somatic dysfunction of cervical region: Secondary | ICD-10-CM | POA: Diagnosis not present

## 2024-03-04 DIAGNOSIS — M5126 Other intervertebral disc displacement, lumbar region: Secondary | ICD-10-CM | POA: Diagnosis not present

## 2024-03-04 DIAGNOSIS — M9902 Segmental and somatic dysfunction of thoracic region: Secondary | ICD-10-CM | POA: Diagnosis not present

## 2024-03-04 DIAGNOSIS — M9903 Segmental and somatic dysfunction of lumbar region: Secondary | ICD-10-CM | POA: Diagnosis not present

## 2024-03-04 DIAGNOSIS — M5022 Other cervical disc displacement, mid-cervical region, unspecified level: Secondary | ICD-10-CM | POA: Diagnosis not present

## 2024-03-04 DIAGNOSIS — M5124 Other intervertebral disc displacement, thoracic region: Secondary | ICD-10-CM | POA: Diagnosis not present

## 2024-03-06 DIAGNOSIS — M5124 Other intervertebral disc displacement, thoracic region: Secondary | ICD-10-CM | POA: Diagnosis not present

## 2024-03-06 DIAGNOSIS — M5022 Other cervical disc displacement, mid-cervical region, unspecified level: Secondary | ICD-10-CM | POA: Diagnosis not present

## 2024-03-06 DIAGNOSIS — M9903 Segmental and somatic dysfunction of lumbar region: Secondary | ICD-10-CM | POA: Diagnosis not present

## 2024-03-06 DIAGNOSIS — M9902 Segmental and somatic dysfunction of thoracic region: Secondary | ICD-10-CM | POA: Diagnosis not present

## 2024-03-06 DIAGNOSIS — M9901 Segmental and somatic dysfunction of cervical region: Secondary | ICD-10-CM | POA: Diagnosis not present

## 2024-03-06 DIAGNOSIS — M5126 Other intervertebral disc displacement, lumbar region: Secondary | ICD-10-CM | POA: Diagnosis not present

## 2024-03-11 DIAGNOSIS — M5126 Other intervertebral disc displacement, lumbar region: Secondary | ICD-10-CM | POA: Diagnosis not present

## 2024-03-11 DIAGNOSIS — M9903 Segmental and somatic dysfunction of lumbar region: Secondary | ICD-10-CM | POA: Diagnosis not present

## 2024-03-11 DIAGNOSIS — M9901 Segmental and somatic dysfunction of cervical region: Secondary | ICD-10-CM | POA: Diagnosis not present

## 2024-03-11 DIAGNOSIS — M5124 Other intervertebral disc displacement, thoracic region: Secondary | ICD-10-CM | POA: Diagnosis not present

## 2024-03-11 DIAGNOSIS — M9902 Segmental and somatic dysfunction of thoracic region: Secondary | ICD-10-CM | POA: Diagnosis not present

## 2024-03-11 DIAGNOSIS — M5022 Other cervical disc displacement, mid-cervical region, unspecified level: Secondary | ICD-10-CM | POA: Diagnosis not present

## 2024-03-13 DIAGNOSIS — M5126 Other intervertebral disc displacement, lumbar region: Secondary | ICD-10-CM | POA: Diagnosis not present

## 2024-03-13 DIAGNOSIS — M9902 Segmental and somatic dysfunction of thoracic region: Secondary | ICD-10-CM | POA: Diagnosis not present

## 2024-03-13 DIAGNOSIS — M5022 Other cervical disc displacement, mid-cervical region, unspecified level: Secondary | ICD-10-CM | POA: Diagnosis not present

## 2024-03-13 DIAGNOSIS — M9901 Segmental and somatic dysfunction of cervical region: Secondary | ICD-10-CM | POA: Diagnosis not present

## 2024-03-13 DIAGNOSIS — M9903 Segmental and somatic dysfunction of lumbar region: Secondary | ICD-10-CM | POA: Diagnosis not present

## 2024-03-13 DIAGNOSIS — M5124 Other intervertebral disc displacement, thoracic region: Secondary | ICD-10-CM | POA: Diagnosis not present

## 2024-03-18 ENCOUNTER — Ambulatory Visit
Admission: RE | Admit: 2024-03-18 | Discharge: 2024-03-18 | Disposition: A | Discharge status: Home / Self Care | Source: Ambulatory Visit | Attending: Physician Assistant | Admitting: Physician Assistant

## 2024-03-18 ENCOUNTER — Other Ambulatory Visit: Payer: Self-pay | Admitting: Physician Assistant

## 2024-03-18 DIAGNOSIS — R0689 Other abnormalities of breathing: Secondary | ICD-10-CM | POA: Diagnosis not present

## 2024-03-18 DIAGNOSIS — R058 Other specified cough: Secondary | ICD-10-CM

## 2024-03-18 DIAGNOSIS — K219 Gastro-esophageal reflux disease without esophagitis: Secondary | ICD-10-CM | POA: Diagnosis not present

## 2024-03-18 DIAGNOSIS — F419 Anxiety disorder, unspecified: Secondary | ICD-10-CM | POA: Diagnosis not present

## 2024-03-18 DIAGNOSIS — R053 Chronic cough: Secondary | ICD-10-CM | POA: Diagnosis not present

## 2024-03-18 DIAGNOSIS — C50919 Malignant neoplasm of unspecified site of unspecified female breast: Secondary | ICD-10-CM | POA: Diagnosis not present

## 2024-03-27 DIAGNOSIS — M5124 Other intervertebral disc displacement, thoracic region: Secondary | ICD-10-CM | POA: Diagnosis not present

## 2024-03-27 DIAGNOSIS — M5022 Other cervical disc displacement, mid-cervical region, unspecified level: Secondary | ICD-10-CM | POA: Diagnosis not present

## 2024-03-27 DIAGNOSIS — M9903 Segmental and somatic dysfunction of lumbar region: Secondary | ICD-10-CM | POA: Diagnosis not present

## 2024-03-27 DIAGNOSIS — M9901 Segmental and somatic dysfunction of cervical region: Secondary | ICD-10-CM | POA: Diagnosis not present

## 2024-03-27 DIAGNOSIS — M5126 Other intervertebral disc displacement, lumbar region: Secondary | ICD-10-CM | POA: Diagnosis not present

## 2024-03-27 DIAGNOSIS — M9902 Segmental and somatic dysfunction of thoracic region: Secondary | ICD-10-CM | POA: Diagnosis not present

## 2024-04-02 DIAGNOSIS — M9901 Segmental and somatic dysfunction of cervical region: Secondary | ICD-10-CM | POA: Diagnosis not present

## 2024-04-02 DIAGNOSIS — M5124 Other intervertebral disc displacement, thoracic region: Secondary | ICD-10-CM | POA: Diagnosis not present

## 2024-04-02 DIAGNOSIS — M9902 Segmental and somatic dysfunction of thoracic region: Secondary | ICD-10-CM | POA: Diagnosis not present

## 2024-04-02 DIAGNOSIS — M9903 Segmental and somatic dysfunction of lumbar region: Secondary | ICD-10-CM | POA: Diagnosis not present

## 2024-04-02 DIAGNOSIS — M5126 Other intervertebral disc displacement, lumbar region: Secondary | ICD-10-CM | POA: Diagnosis not present

## 2024-04-02 DIAGNOSIS — M5022 Other cervical disc displacement, mid-cervical region, unspecified level: Secondary | ICD-10-CM | POA: Diagnosis not present

## 2024-04-09 DIAGNOSIS — M5022 Other cervical disc displacement, mid-cervical region, unspecified level: Secondary | ICD-10-CM | POA: Diagnosis not present

## 2024-04-09 DIAGNOSIS — M5126 Other intervertebral disc displacement, lumbar region: Secondary | ICD-10-CM | POA: Diagnosis not present

## 2024-04-09 DIAGNOSIS — M9903 Segmental and somatic dysfunction of lumbar region: Secondary | ICD-10-CM | POA: Diagnosis not present

## 2024-04-09 DIAGNOSIS — M9901 Segmental and somatic dysfunction of cervical region: Secondary | ICD-10-CM | POA: Diagnosis not present

## 2024-04-09 DIAGNOSIS — M5124 Other intervertebral disc displacement, thoracic region: Secondary | ICD-10-CM | POA: Diagnosis not present

## 2024-04-09 DIAGNOSIS — M9902 Segmental and somatic dysfunction of thoracic region: Secondary | ICD-10-CM | POA: Diagnosis not present

## 2024-04-26 DIAGNOSIS — M65331 Trigger finger, right middle finger: Secondary | ICD-10-CM | POA: Diagnosis not present

## 2024-04-26 DIAGNOSIS — B079 Viral wart, unspecified: Secondary | ICD-10-CM | POA: Diagnosis not present

## 2024-04-26 DIAGNOSIS — M65341 Trigger finger, right ring finger: Secondary | ICD-10-CM | POA: Diagnosis not present

## 2024-05-01 DIAGNOSIS — G5711 Meralgia paresthetica, right lower limb: Secondary | ICD-10-CM | POA: Diagnosis not present

## 2024-05-01 DIAGNOSIS — M51369 Other intervertebral disc degeneration, lumbar region without mention of lumbar back pain or lower extremity pain: Secondary | ICD-10-CM | POA: Diagnosis not present

## 2024-05-01 DIAGNOSIS — M8588 Other specified disorders of bone density and structure, other site: Secondary | ICD-10-CM | POA: Diagnosis not present

## 2024-05-01 DIAGNOSIS — L729 Follicular cyst of the skin and subcutaneous tissue, unspecified: Secondary | ICD-10-CM | POA: Diagnosis not present

## 2024-05-01 DIAGNOSIS — M79672 Pain in left foot: Secondary | ICD-10-CM | POA: Diagnosis not present

## 2024-05-09 ENCOUNTER — Ambulatory Visit (INDEPENDENT_AMBULATORY_CARE_PROVIDER_SITE_OTHER)

## 2024-05-09 ENCOUNTER — Encounter: Payer: Self-pay | Admitting: Podiatry

## 2024-05-09 ENCOUNTER — Ambulatory Visit: Admitting: Podiatry

## 2024-05-09 DIAGNOSIS — M67472 Ganglion, left ankle and foot: Secondary | ICD-10-CM | POA: Diagnosis not present

## 2024-05-09 DIAGNOSIS — M898X7 Other specified disorders of bone, ankle and foot: Secondary | ICD-10-CM | POA: Diagnosis not present

## 2024-05-09 DIAGNOSIS — M67479 Ganglion, unspecified ankle and foot: Secondary | ICD-10-CM

## 2024-05-09 DIAGNOSIS — M2012 Hallux valgus (acquired), left foot: Secondary | ICD-10-CM

## 2024-05-09 NOTE — Progress Notes (Signed)
 Subjective:  Patient ID: Angelica Hartman, female    DOB: 1956/04/26,  MRN: [id number] HPI Chief Complaint  Patient presents with   cyst    Rm8 Painful cyst left foot doral/ I year/ growing in size and firm to touch/ no treatment    68 y.o. female presents with the above complaint.   ROS: Denies fever chills nausea vomiting muscle aches pains calf pain back pain chest pain shortness of breath.  Goes on to say that she has not only pain from the small cyst or painful skin lesion but it has pain and difficulty with her bunion deformity on her left foot.  She states it has worsened over time since it is worsening this lesion has popped up.  She tried anti-inflammatories change in shoe gear has not been seen by dermatology as well as hand surgeon who recommended that she follow-up with us  for surgical intervention.  Past Medical History:  Diagnosis Date   Breast cancer (HCC)    GERD (gastroesophageal reflux disease)    History of Helicobacter pylori infection    History of radiation therapy    Right breast-09/21/23-10/18/23- Dr. Retta Caster   Seasonal allergies    Past Surgical History:  Procedure Laterality Date   ABDOMINAL SURGERY     BREAST LUMPECTOMY Right    BREAST LUMPECTOMY WITH RADIOACTIVE SEED LOCALIZATION Right 08/10/2023   Procedure: RIGHT BREAST SEED GUIDED LUMPECTOMY;  Surgeon: Enid Harry, MD;  Location: [facility] OR;  Service: General;  Laterality: Right;    Current Outpatient Medications:    anastrozole  (ARIMIDEX ) 1 MG tablet, Take 1 tablet (1 mg total) by mouth daily., Disp: 90 tablet, Rfl: 3   atorvastatin (LIPITOR) 10 MG tablet, Take 10 mg by mouth daily., Disp: , Rfl:    cetirizine  (ZYRTEC ) 10 MG tablet, Take 1 tablet (10 mg total) by mouth daily., Disp: 30 tablet, Rfl: 0   Collagen Hydrolysate POWD, by Does not apply route. 2 tablespoons a day, Disp: , Rfl:    ibuprofen (ADVIL) 200 MG tablet, Take 400 mg by mouth every 6 (six) hours as needed for mild pain, headache  or moderate pain., Disp: , Rfl:    LORazepam (ATIVAN) 0.5 MG tablet, Take 0.5 mg by mouth 2 (two) times daily as needed for anxiety. , Disp: , Rfl:    MAGNESIUM GLUCONATE PO, Take 1,400 mg by mouth at bedtime. 350 mg each, Disp: , Rfl:    Omeprazole  20 MG TBDD, Take 20 mg by mouth daily., Disp: , Rfl:    ondansetron  (ZOFRAN ) 4 MG tablet, Take 1 tablet (4 mg total) by mouth every 8 (eight) hours as needed for nausea or vomiting., Disp: 20 tablet, Rfl: 0   Probiotic Product (PROBIOTIC PO), Take 1 tablet by mouth daily. 25 billion cfu, Disp: , Rfl:    sertraline  (ZOLOFT ) 25 MG tablet, Take 1 tablet (25 mg total) by mouth daily. Increase to 2 tablets and 7 days. (Patient taking differently: Take 25 mg by mouth daily.), Disp: 60 tablet, Rfl: 1   traMADol  (ULTRAM ) 50 MG tablet, Take 1 tablet (50 mg total) by mouth every 8 (eight) hours as needed., Disp: 10 tablet, Rfl: 0   vitamin C (ASCORBIC ACID) 250 MG tablet, Take 500 mg by mouth daily., Disp: , Rfl:   Allergies  Allergen Reactions   Latex     Other Reaction(s): Other (See Comments)   Pseudoephedrine Hcl Other (See Comments)    Hair thin   Wound Dressing Adhesive Other (See Comments)  Codeine Nausea Only   Penicillins Rash   Review of Systems Objective:  There were no vitals filed for this visit.  General: Well developed, nourished, in no acute distress, alert and oriented x3   Dermatological: Skin is warm, dry and supple bilateral. Nails x 10 are well maintained; remaining integument appears unremarkable at this time. There are no open sores, no preulcerative lesions, no rash or signs of infection present.  Painful 5 mm cystic lesion feels firm nonpulsatile much like a fibroma however it has a superficial ulcerative lesion dorsally.  Currently nonbleeding noninfected.  Vascular: Dorsalis Pedis artery and Posterior Tibial artery pedal pulses are 2/4 bilateral with immedate capillary fill time. Pedal hair growth present. No varicosities and  no lower extremity edema present bilateral.   Neruologic: Grossly intact via light touch bilateral. Vibratory intact via tuning fork bilateral. Protective threshold with Semmes Wienstein monofilament intact to all pedal sites bilateral. Patellar and Achilles deep tendon reflexes 2+ bilateral. No Babinski or clonus noted bilateral.   Musculoskeletal: No gross boney pedal deformities bilateral. No pain, crepitus, or limitation noted with foot and ankle range of motion bilateral. Muscular strength 5/5 in all groups tested bilateral.  Palpable exostosis beneath the base of the proximal phalanx where there is a spur present.  She also has pain on palpation and range of motion first metatarsal phalangeal joint it appears to be tracking but not track bound no crepitation on range of motion.  Gait: Unassisted, Nonantalgic.    Radiographs:  Radiographs of her left foot taken today demonstrate 3 views with normal osseous architecture with the exception of a mild tailor's bunion deformity and a significant hallux valgus deformity with increase in the first intermetatarsal angle and hallux abductus angle greater than normal values.  There is dorsal spurring joint space narrowing subchondral sclerosis most likely resulting in the symptomatology of this painful lesion.  Assessment & Plan:   Assessment: Painful benign neoplasm skin hallux left.  Exostosis base proximal phalanx hallux left.  Hallux abductovalgus deformity left foot.  Plan: Discussed etiology pathology conservative surgical therapies at this point all conservative therapies have failed to render her asymptomatic so we consented her for a capital osteotomy with exostectomy of the proximal phalanx and screw fixation.  We will also excise the lesion and send it for pathologic evaluation.  We discussed the pros and cons of surgery and possible side effects and benefits from this possible side effects may consist of but not limited to postop pain  bleeding swell infection recurrence need for further surgery overcorrection under correction loss of digit loss limb loss of life.  We discussed a nerve block by the anesthesia group at the surgery center.  She understands this is amenable to it we will follow-up with me in September for surgery.  Will dispense a cam walker at that time.     Anastasio Wogan T. [place], [place]

## 2024-05-10 ENCOUNTER — Telehealth: Payer: Self-pay | Admitting: Podiatry

## 2024-05-10 NOTE — Telephone Encounter (Signed)
 Received surgical consent forms.  Called pt and she is wanting to hold off on surgery for now, she is not mentally ready for surgery.  I did tell her that the consent is valid for 6 months and to call me when she is ready to get the surgery scheduled.

## 2024-07-17 DIAGNOSIS — L821 Other seborrheic keratosis: Secondary | ICD-10-CM | POA: Diagnosis not present

## 2024-07-17 DIAGNOSIS — L812 Freckles: Secondary | ICD-10-CM | POA: Diagnosis not present

## 2024-08-12 DIAGNOSIS — F419 Anxiety disorder, unspecified: Secondary | ICD-10-CM | POA: Diagnosis not present

## 2024-08-12 DIAGNOSIS — Z79899 Other long term (current) drug therapy: Secondary | ICD-10-CM | POA: Diagnosis not present

## 2024-08-12 DIAGNOSIS — N951 Menopausal and female climacteric states: Secondary | ICD-10-CM | POA: Diagnosis not present

## 2024-08-12 DIAGNOSIS — K219 Gastro-esophageal reflux disease without esophagitis: Secondary | ICD-10-CM | POA: Diagnosis not present

## 2024-08-12 DIAGNOSIS — C50919 Malignant neoplasm of unspecified site of unspecified female breast: Secondary | ICD-10-CM | POA: Diagnosis not present

## 2024-08-12 DIAGNOSIS — Z23 Encounter for immunization: Secondary | ICD-10-CM | POA: Diagnosis not present

## 2024-08-14 ENCOUNTER — Telehealth: Payer: Self-pay | Admitting: Lab

## 2024-08-14 NOTE — Telephone Encounter (Signed)
 Patient is ready to move forward with surgery for December please advise on what steps are needed.

## 2024-08-15 NOTE — Telephone Encounter (Signed)
 Called pt and she is scheduled for 11/08/24 for surgery. Is not on blood thinners or GLP1 medications. Pharmacy is correct in chart.

## 2024-08-26 DIAGNOSIS — R92323 Mammographic fibroglandular density, bilateral breasts: Secondary | ICD-10-CM | POA: Diagnosis not present

## 2024-08-26 DIAGNOSIS — R928 Other abnormal and inconclusive findings on diagnostic imaging of breast: Secondary | ICD-10-CM | POA: Diagnosis not present

## 2024-08-26 DIAGNOSIS — N6315 Unspecified lump in the right breast, overlapping quadrants: Secondary | ICD-10-CM | POA: Diagnosis not present

## 2024-08-30 DIAGNOSIS — Z860100 Personal history of colon polyps, unspecified: Secondary | ICD-10-CM | POA: Diagnosis not present

## 2024-08-30 DIAGNOSIS — K6289 Other specified diseases of anus and rectum: Secondary | ICD-10-CM | POA: Diagnosis not present

## 2024-09-05 DIAGNOSIS — Z170 Estrogen receptor positive status [ER+]: Secondary | ICD-10-CM | POA: Diagnosis not present

## 2024-09-05 DIAGNOSIS — C50511 Malignant neoplasm of lower-outer quadrant of right female breast: Secondary | ICD-10-CM | POA: Diagnosis not present

## 2024-09-10 ENCOUNTER — Encounter: Payer: Self-pay | Admitting: Podiatrist

## 2024-09-10 ENCOUNTER — Ambulatory Visit: Admitting: Podiatrist

## 2024-09-10 ENCOUNTER — Ambulatory Visit (INDEPENDENT_AMBULATORY_CARE_PROVIDER_SITE_OTHER)

## 2024-09-10 DIAGNOSIS — K6289 Other specified diseases of anus and rectum: Secondary | ICD-10-CM | POA: Insufficient documentation

## 2024-09-10 DIAGNOSIS — M7672 Peroneal tendinitis, left leg: Secondary | ICD-10-CM

## 2024-09-10 DIAGNOSIS — Z860100 Personal history of colon polyps, unspecified: Secondary | ICD-10-CM | POA: Insufficient documentation

## 2024-09-10 NOTE — Progress Notes (Signed)
 Chief Complaint  Patient presents with   Foot Pain    Lateral foot left - aching x 2 weeks, no injury, does work with a Psychologist, educational and she thought she may have rolled it, takes Ibuprofen - helps, has surgery scheduled with Dr. Verta in December     HPI: Patient is 68 y.o. female who presents today for pain on the lateral left foot- relates no injury. States she was walking in some shoes that aren't her best shoes and she noticed foot pain after this.  She has an aching pain that is not responding to ibuprofen. She is scheduled for bunion surgery/ cyst removal with Dr. Verta in December on this same left foot and wants to have the area checked.     Allergies  Allergen Reactions   Latex     Other Reaction(s): Other (See Comments)   Pseudoephedrine Hcl Other (See Comments)    Hair thin   Wound Dressing Adhesive Other (See Comments)   Codeine Nausea Only   Penicillins Rash    Review of systems is negative except as noted in the HPI.  Denies nausea/ vomiting/ fevers/ chills or night sweats.   Denies difficulty breathing, denies calf pain or tenderness  Physical Exam  Patient is awake, alert, and oriented x 3.  In no acute distress.    Vascular status is intact with palpable pedal pulses DP and PT bilateral and capillary refill time less than 3 seconds bilateral.  No edema or erythema noted.   Neurological exam reveals epicritic and protective sensation grossly intact bilateral.   Dermatological exam reveals skin is supple and dry to bilateral feet.  No open lesions present.    Musculoskeletal exam: Musculature intact with dorsiflexion, plantarflexion, inversion, eversion. Pain left lateral foot is noted at the peroneus  brevis tendon insertion.   Radiographic exam:  Normal osseous mineralization.  No fracture or dislocation or acute osseous abnormalities present.  Joint spaces are normal.  HAV deformity is present.     Assessment:   ICD-10-CM   1. Peroneal tendinitis of left lower  extremity  M76.72 DG Foot Complete Left       Plan: Exam findings and treatment recommendations are discussed.  Recommended an injection into the area of tenderness and the patient agreed.  Also recommended she toss the shoes that are unsupportive and may have caused this issue.  She is to ice and use ibuprofen/ aleve as needed. She will return if not improvement in 2-3 weeks.  Procedure: Injection left lateral foot Discussed alternatives, risks, complications and verbal consent was obtained.  Location: left foot.  Skin Prep: Alcohol. Injectate: 1cc 0.5% marcaine  plain, 1 cc 10 mg Kenalog  Disposition: Patient tolerated procedure well. Injection site dressed with a band-aid.  Post-injection care was discussed and return precautions discussed.

## 2024-09-25 DIAGNOSIS — H5211 Myopia, right eye: Secondary | ICD-10-CM | POA: Diagnosis not present

## 2024-09-25 DIAGNOSIS — H2513 Age-related nuclear cataract, bilateral: Secondary | ICD-10-CM | POA: Diagnosis not present

## 2024-10-04 ENCOUNTER — Telehealth: Payer: Self-pay | Admitting: Podiatry

## 2024-10-04 NOTE — Telephone Encounter (Signed)
 DOS- 11/08/2024  AUSTIN BUNIONECTOMY LT- 71703 EXCISION BENIGN LESION 2.0CM LT- 11422 EXOSTECTOMY LT- 71891  [employer]ET[employer] EFFECTIVE DATE- 06/05/2023  DEDUCTIBLE- $0 REMAINING- $0 OOP- $4150 REMAINING- $3851 COINSURANCE- 0%  PER AVAILITY PORTAL, NO PRIOR AUTHS ARE REQUIRED FOR CPT CODES 71703, [id number], AND 28108. DOCUMENTATION ATTACHED TO SURGERY CONSENT PACKET.

## 2024-10-23 DIAGNOSIS — E7800 Pure hypercholesterolemia, unspecified: Secondary | ICD-10-CM | POA: Diagnosis not present

## 2024-10-23 DIAGNOSIS — Z79899 Other long term (current) drug therapy: Secondary | ICD-10-CM | POA: Diagnosis not present

## 2024-10-30 ENCOUNTER — Telehealth: Payer: Self-pay | Admitting: Lab

## 2024-10-30 NOTE — Telephone Encounter (Signed)
 Patient calling regarding surgery and pain medication she wants provider to know she does not want Narco states would like Tramadol  or a stronger Tylenol . I discussed with patient that she will have a chance to speak to provider prior to surgery and can discuss and would also send message.

## 2024-11-06 ENCOUNTER — Other Ambulatory Visit: Payer: Self-pay | Admitting: Podiatry

## 2024-11-06 MED ORDER — TRAMADOL HCL 50 MG PO TABS: 50.0000 mg | ORAL_TABLET | Freq: Three times a day (TID) | ORAL | 0 refills | Status: AC | PRN

## 2024-11-06 MED ORDER — CLINDAMYCIN HCL 150 MG PO CAPS: 150.0000 mg | ORAL_CAPSULE | Freq: Three times a day (TID) | ORAL | 0 refills | Status: DC

## 2024-11-06 MED ORDER — ONDANSETRON HCL 4 MG PO TABS: 4.0000 mg | ORAL_TABLET | Freq: Three times a day (TID) | ORAL | 0 refills | Status: AC | PRN

## 2024-11-08 DIAGNOSIS — M67472 Ganglion, left ankle and foot: Secondary | ICD-10-CM | POA: Diagnosis not present

## 2024-11-08 DIAGNOSIS — G8918 Other acute postprocedural pain: Secondary | ICD-10-CM | POA: Diagnosis not present

## 2024-11-08 DIAGNOSIS — M898X7 Other specified disorders of bone, ankle and foot: Secondary | ICD-10-CM | POA: Diagnosis not present

## 2024-11-08 DIAGNOSIS — M2012 Hallux valgus (acquired), left foot: Secondary | ICD-10-CM | POA: Diagnosis not present

## 2024-11-08 DIAGNOSIS — D2122 Benign neoplasm of connective and other soft tissue of left lower limb, including hip: Secondary | ICD-10-CM | POA: Diagnosis not present

## 2024-11-14 ENCOUNTER — Ambulatory Visit (INDEPENDENT_AMBULATORY_CARE_PROVIDER_SITE_OTHER)

## 2024-11-14 ENCOUNTER — Encounter

## 2024-11-14 ENCOUNTER — Ambulatory Visit: Admitting: Podiatry

## 2024-11-14 ENCOUNTER — Encounter: Payer: Self-pay | Admitting: Podiatry

## 2024-11-14 VITALS — Temp 97.9°F

## 2024-11-14 DIAGNOSIS — M2012 Hallux valgus (acquired), left foot: Secondary | ICD-10-CM

## 2024-11-14 NOTE — Progress Notes (Unsigned)
 Subjective:  Patient ID: Angelica Hartman, female    DOB: 19-Jul-1956,  MRN: [id number]  Chief Complaint  Patient presents with   Routine Post Op    POV # 1 DOS 11/08/24 LT AUSTIN BUNIONECTOMY W/ SCREW, LT EXCISION BENIGN NEOPLASM SKIN, LT BASE PHALANX EXOSTECTOMY( HYATT PT ) Pt has been wearing boot when weight bearing. Soreness at site. Sutures in place. Some redness and swelling.  Pt has not been able to tolerate the antibiotics, has been vomiting and having stomach cramps. Stopped taking yesterday evening.      DOS: 11/08/2024 with Dr. Verta Procedure: Left Massie bunionectomy with excision of benign skin neoplasm of  68 y.o. female returns for post-op check.  She reports her pain has been well-controlled.  She presents with postop shoe.  She reports that she has been experiencing nausea and vomiting starting yesterday.  She attributes this to being on clindamycin .  She reports having 1 day left of antibiotic.  She did have Zofran  sent to her pharmacy with her postop medications but she has not been taking this.  Review of Systems: Negative except as noted in the HPI. Denies F/Ch.  Past Medical History:  Diagnosis Date   Breast cancer (HCC)    GERD (gastroesophageal reflux disease)    History of Helicobacter pylori infection    History of radiation therapy    Right breast-09/21/23-10/18/23- Dr. Lynwood Nasuti   Seasonal allergies    Current Medications[1]  Tobacco Use History[2]  Allergies[3] Objective:   Vitals:   11/14/24 1046  Temp: 97.9 F (36.6 C)   There is no height or weight on file to calculate BMI. Constitutional Well developed. Well nourished.  Vascular Foot warm and well perfused. Capillary refill normal to all digits.   Neurologic Normal speech. Oriented to person, place, and time. Epicritic sensation to light touch grossly present bilaterally.  Dermatologic Left foot sutures intact without evidence of gapping.  Skin healing well without signs of infection. Skin  edges well coapted without signs of infection.  Orthopedic: Tenderness to palpation noted about the surgical site.   Radiographs: Left foot radiographs 11/14/2024 Postsurgical correction noted status post left Austin bunionectomy.  Correction is maintained.  Hardware noted and intact.  No evidence of loosening noted. Assessment:   1. Acquired hallux valgus of left foot    Plan:  Patient was evaluated and treated and all questions answered.  S/p foot surgery left -Progressing as expected post-operatively. -XR: Reviewed with patient -WB Status: Weightbearing as tolerated in surgical shoe -Sutures: Left intact. -Medications: Pain well-controlled.  Encouraged patient to start taking her Zofran  every 6-8 hours as needed for nausea and vomiting.  Discontinue the clindamycin .  Encouraged resuming home probiotic. -Foot redressed. - Encouraged passive range of motion exercises left first toe. Postop appointment with Dr. Laurance 11/26/2024  No follow-ups on file.        [1]  Current Outpatient Medications:    cetirizine  (ZYRTEC ) 10 MG tablet, Take 1 tablet (10 mg total) by mouth daily., Disp: 30 tablet, Rfl: 0   clindamycin  (CLEOCIN ) 150 MG capsule, Take 1 capsule (150 mg total) by mouth 3 (three) times daily., Disp: 30 capsule, Rfl: 0   Collagen Hydrolysate POWD, by Does not apply route. 2 tablespoons a day, Disp: , Rfl:    ibuprofen (ADVIL) 200 MG tablet, Take 400 mg by mouth every 6 (six) hours as needed for mild pain, headache or moderate pain., Disp: , Rfl:    LORazepam (ATIVAN) 0.5 MG tablet, Take 0.5  mg by mouth 2 (two) times daily as needed for anxiety. , Disp: , Rfl:    MAGNESIUM GLUCONATE PO, Take 1,400 mg by mouth at bedtime. 350 mg each, Disp: , Rfl:    Omeprazole  20 MG TBDD, Take 20 mg by mouth daily., Disp: , Rfl:    ondansetron  (ZOFRAN ) 4 MG tablet, Take 1 tablet (4 mg total) by mouth every 8 (eight) hours as needed., Disp: 20 tablet, Rfl: 0   Probiotic Product (PROBIOTIC PO),  Take 1 tablet by mouth daily. 25 billion cfu, Disp: , Rfl:    sertraline  (ZOLOFT ) 25 MG tablet, Take 1 tablet (25 mg total) by mouth daily. Increase to 2 tablets and 7 days. (Patient taking differently: Take 25 mg by mouth daily.), Disp: 60 tablet, Rfl: 1 [2]  Social History Tobacco Use  Smoking Status Former  Smokeless Tobacco Never  [3]  Allergies Allergen Reactions   Latex     Other Reaction(s): Other (See Comments)   Pseudoephedrine Hcl Other (See Comments)    Hair thin   Wound Dressing Adhesive Other (See Comments)   Codeine Nausea Only   Penicillins Rash

## 2024-11-15 ENCOUNTER — Encounter: Payer: Self-pay | Admitting: Lab

## 2024-11-26 ENCOUNTER — Encounter: Payer: Self-pay | Admitting: Podiatry

## 2024-11-26 ENCOUNTER — Ambulatory Visit: Admitting: Podiatry

## 2024-11-26 ENCOUNTER — Encounter

## 2024-11-26 DIAGNOSIS — M67479 Ganglion, unspecified ankle and foot: Secondary | ICD-10-CM

## 2024-11-26 DIAGNOSIS — M7672 Peroneal tendinitis, left leg: Secondary | ICD-10-CM

## 2024-11-26 DIAGNOSIS — Z9889 Other specified postprocedural states: Secondary | ICD-10-CM

## 2024-11-26 DIAGNOSIS — M2012 Hallux valgus (acquired), left foot: Secondary | ICD-10-CM | POA: Diagnosis not present

## 2024-11-26 DIAGNOSIS — M898X7 Other specified disorders of bone, ankle and foot: Secondary | ICD-10-CM

## 2024-11-26 NOTE — Progress Notes (Signed)
 She presents today for postop visit date of surgery was 11/08/2024 left [facility] bunion repair excision benign lesion skin and exostectomy of the phalanx.  States that she is doing very good and I really would like to get out of this boot and get into a tennis shoe.  Objective: Vital signs are stable alert and oriented x 3.  Pulses are palpable.  Once the dry sterile dressing was removed sutures were remaining which were removed today.  Margins remain well coapted she has great range of motion of the first metatarsal phalangeal joint and it is nontender.  Assessment: Well-healing surgical foot.  Plan: Unguinal place her in a Darco shoe today I requested that she stay in this until I follow-up with her in a couple of weeks and we will rex-ray at that time.

## 2024-11-27 ENCOUNTER — Encounter: Payer: Self-pay | Admitting: Podiatry

## 2024-11-29 DIAGNOSIS — N390 Urinary tract infection, site not specified: Secondary | ICD-10-CM | POA: Diagnosis not present

## 2024-12-19 ENCOUNTER — Encounter: Admitting: Podiatry

## 2024-12-24 ENCOUNTER — Ambulatory Visit

## 2024-12-24 ENCOUNTER — Ambulatory Visit: Admitting: Podiatry

## 2024-12-24 ENCOUNTER — Encounter: Payer: Self-pay | Admitting: Podiatry

## 2024-12-24 DIAGNOSIS — M67479 Ganglion, unspecified ankle and foot: Secondary | ICD-10-CM

## 2024-12-24 DIAGNOSIS — M2012 Hallux valgus (acquired), left foot: Secondary | ICD-10-CM | POA: Diagnosis not present

## 2024-12-24 DIAGNOSIS — M898X7 Other specified disorders of bone, ankle and foot: Secondary | ICD-10-CM

## 2024-12-24 DIAGNOSIS — Z9889 Other specified postprocedural states: Secondary | ICD-10-CM

## 2024-12-24 DIAGNOSIS — M7672 Peroneal tendinitis, left leg: Secondary | ICD-10-CM

## 2024-12-24 NOTE — Progress Notes (Signed)
 She presents today date of surgery 11/08/2024 she is status post left foot [place] bunionectomy with excision of lesion.  States that is doing just fine no problems whatsoever she is very happy with the outcome thus far she is back in her tennis shoes she says.  Objective: Vital signs are stable alert oriented x 3.  There is no erythema edema cellulitis drainage or odor.  She has got great range of motion of the first metatarsal phalangeal joint left foot.  Radiographs taken today demonstrate well-healing osteotomy screws intact and in good position.  Assessment: Well-healing surgical foot.  Plan: Like to follow-up with her in 1 more month for another set of x-rays in the boot we will release her.  I would allow her to remain in her regular shoe at this point and explained to her the do's and don'ts as well as to her husband who is with her today.

## 2025-01-28 ENCOUNTER — Encounter: Admitting: Podiatry
